# Patient Record
Sex: Male | Born: 1965 | Race: White | Hispanic: No | Marital: Married | Smoking: Current every day smoker
Health system: Southern US, Community
[De-identification: ages and names within clinical notes are randomized; demographics above are authoritative.]

## PROBLEM LIST (undated history)

## (undated) DIAGNOSIS — I4891 Unspecified atrial fibrillation: Secondary | ICD-10-CM

## (undated) DIAGNOSIS — I471 Supraventricular tachycardia, unspecified: Secondary | ICD-10-CM

## (undated) DIAGNOSIS — E039 Hypothyroidism, unspecified: Secondary | ICD-10-CM

## (undated) DIAGNOSIS — E785 Hyperlipidemia, unspecified: Secondary | ICD-10-CM

## (undated) DIAGNOSIS — I509 Heart failure, unspecified: Secondary | ICD-10-CM

## (undated) DIAGNOSIS — J449 Chronic obstructive pulmonary disease, unspecified: Secondary | ICD-10-CM

## (undated) DIAGNOSIS — J410 Simple chronic bronchitis: Secondary | ICD-10-CM

## (undated) DIAGNOSIS — K420 Umbilical hernia with obstruction, without gangrene: Secondary | ICD-10-CM

## (undated) DIAGNOSIS — I10 Essential (primary) hypertension: Secondary | ICD-10-CM

## (undated) HISTORY — DX: Umbilical hernia with obstruction, without gangrene: K42.0

---

## 2008-10-19 ENCOUNTER — Emergency Department: Payer: Self-pay | Admitting: Emergency Medicine

## 2011-01-26 ENCOUNTER — Emergency Department: Payer: Self-pay | Admitting: Emergency Medicine

## 2013-12-27 ENCOUNTER — Emergency Department: Payer: Self-pay | Admitting: Emergency Medicine

## 2015-12-08 ENCOUNTER — Ambulatory Visit: Payer: Self-pay

## 2015-12-08 DIAGNOSIS — Z299 Encounter for prophylactic measures, unspecified: Secondary | ICD-10-CM

## 2015-12-09 LAB — CMP12+LP+TP+TSH+6AC+PSA+CBC…
ALBUMIN: 4.7 g/dL (ref 3.5–5.5)
ALK PHOS: 83 IU/L (ref 39–117)
ALT: 40 IU/L (ref 0–44)
AST: 25 IU/L (ref 0–40)
Albumin/Globulin Ratio: 1.4 (ref 1.1–2.5)
BASOS ABS: 0 10*3/uL (ref 0.0–0.2)
BUN/Creatinine Ratio: 7 — ABNORMAL LOW (ref 9–20)
BUN: 8 mg/dL (ref 6–24)
Basos: 0 %
Bilirubin Total: 0.7 mg/dL (ref 0.0–1.2)
CHOLESTEROL TOTAL: 277 mg/dL — AB (ref 100–199)
Calcium: 9.7 mg/dL (ref 8.7–10.2)
Chloride: 94 mmol/L — ABNORMAL LOW (ref 96–106)
Chol/HDL Ratio: 11.5 ratio units — ABNORMAL HIGH (ref 0.0–5.0)
Creatinine, Ser: 1.15 mg/dL (ref 0.76–1.27)
EOS (ABSOLUTE): 0.2 10*3/uL (ref 0.0–0.4)
EOS: 2 %
ESTIMATED CHD RISK: 2.1 times avg. — AB (ref 0.0–1.0)
FREE THYROXINE INDEX: 3 (ref 1.2–4.9)
GFR calc Af Amer: 85 mL/min/{1.73_m2} (ref >59)
GFR calc non Af Amer: 73 mL/min/{1.73_m2} (ref >59)
GGT: 62 IU/L (ref 0–65)
GLOBULIN, TOTAL: 3.3 g/dL (ref 1.5–4.5)
Glucose: 96 mg/dL (ref 65–99)
HDL: 24 mg/dL — ABNORMAL LOW (ref >39)
HEMOGLOBIN: 16.2 g/dL (ref 12.6–17.7)
Hematocrit: 46.5 % (ref 37.5–51.0)
IMMATURE GRANULOCYTES: 0 %
IRON: 103 ug/dL (ref 38–169)
Immature Grans (Abs): 0 10*3/uL (ref 0.0–0.1)
LDH: 157 IU/L (ref 121–224)
LYMPHS ABS: 2.1 10*3/uL (ref 0.7–3.1)
Lymphs: 21 %
MCH: 31.6 pg (ref 26.6–33.0)
MCHC: 34.8 g/dL (ref 31.5–35.7)
MCV: 91 fL (ref 79–97)
Monocytes Absolute: 0.8 10*3/uL (ref 0.1–0.9)
Monocytes: 8 %
NEUTROS PCT: 69 %
Neutrophils Absolute: 6.9 10*3/uL (ref 1.4–7.0)
PLATELETS: 218 10*3/uL (ref 150–379)
PROSTATE SPECIFIC AG, SERUM: 0.5 ng/mL (ref 0.0–4.0)
Phosphorus: 3.4 mg/dL (ref 2.5–4.5)
Potassium: 5.1 mmol/L (ref 3.5–5.2)
RBC: 5.13 x10E6/uL (ref 4.14–5.80)
RDW: 14.3 % (ref 12.3–15.4)
Sodium: 140 mmol/L (ref 134–144)
T3 UPTAKE RATIO: 25 % (ref 24–39)
T4, Total: 12 ug/dL (ref 4.5–12.0)
TOTAL PROTEIN: 8 g/dL (ref 6.0–8.5)
TRIGLYCERIDES: 522 mg/dL — AB (ref 0–149)
TSH: 14.82 u[IU]/mL — ABNORMAL HIGH (ref 0.450–4.500)
Uric Acid: 9.4 mg/dL — ABNORMAL HIGH (ref 3.7–8.6)
WBC: 10 10*3/uL (ref 3.4–10.8)

## 2015-12-09 NOTE — Progress Notes (Signed)
Lab results have been sent to Dr. Ellsworth Lennox.

## 2016-04-12 ENCOUNTER — Other Ambulatory Visit: Payer: Self-pay

## 2016-04-12 DIAGNOSIS — Z299 Encounter for prophylactic measures, unspecified: Secondary | ICD-10-CM

## 2016-04-12 NOTE — Progress Notes (Signed)
Patient came in to have blood drawn per Dr. Ellsworth Lennoxejan-Sie orders.  Blood was drawn from the right arm without any incident.  Patient wants results sent to Dr. Ellsworth Lennoxejan-Sie when they are finalized.

## 2016-04-13 LAB — TSH: TSH: 4 u[IU]/mL (ref 0.450–4.500)

## 2016-04-13 LAB — HEPATIC FUNCTION PANEL
ALBUMIN: 4.5 g/dL (ref 3.5–5.5)
ALK PHOS: 80 IU/L (ref 39–117)
ALT: 31 IU/L (ref 0–44)
AST: 27 IU/L (ref 0–40)
Bilirubin Total: 0.7 mg/dL (ref 0.0–1.2)
Bilirubin, Direct: 0.16 mg/dL (ref 0.00–0.40)
TOTAL PROTEIN: 7.6 g/dL (ref 6.0–8.5)

## 2016-04-13 LAB — LIPID PANEL
CHOLESTEROL TOTAL: 225 mg/dL — AB (ref 100–199)
Chol/HDL Ratio: 9.4 ratio units — ABNORMAL HIGH (ref 0.0–5.0)
HDL: 24 mg/dL — AB (ref >39)
TRIGLYCERIDES: 524 mg/dL — AB (ref 0–149)

## 2016-06-02 ENCOUNTER — Emergency Department: Payer: Managed Care, Other (non HMO)

## 2016-06-02 ENCOUNTER — Emergency Department
Admission: EM | Admit: 2016-06-02 | Discharge: 2016-06-02 | Disposition: A | Discharge status: Home / Self Care | Payer: Managed Care, Other (non HMO) | Attending: Emergency Medicine | Admitting: Emergency Medicine

## 2016-06-02 DIAGNOSIS — I471 Supraventricular tachycardia: Secondary | ICD-10-CM | POA: Diagnosis not present

## 2016-06-02 DIAGNOSIS — F17210 Nicotine dependence, cigarettes, uncomplicated: Secondary | ICD-10-CM | POA: Diagnosis not present

## 2016-06-02 DIAGNOSIS — N289 Disorder of kidney and ureter, unspecified: Secondary | ICD-10-CM | POA: Diagnosis not present

## 2016-06-02 DIAGNOSIS — Z79899 Other long term (current) drug therapy: Secondary | ICD-10-CM | POA: Diagnosis not present

## 2016-06-02 DIAGNOSIS — I4891 Unspecified atrial fibrillation: Secondary | ICD-10-CM | POA: Diagnosis not present

## 2016-06-02 DIAGNOSIS — R000 Tachycardia, unspecified: Secondary | ICD-10-CM | POA: Diagnosis present

## 2016-06-02 DIAGNOSIS — I10 Essential (primary) hypertension: Secondary | ICD-10-CM | POA: Diagnosis not present

## 2016-06-02 HISTORY — DX: Hyperlipidemia, unspecified: E78.5

## 2016-06-02 HISTORY — DX: Unspecified atrial fibrillation: I48.91

## 2016-06-02 HISTORY — DX: Essential (primary) hypertension: I10

## 2016-06-02 LAB — CBC WITH DIFFERENTIAL/PLATELET
BASOS PCT: 1 %
Basophils Absolute: 0.2 10*3/uL — ABNORMAL HIGH (ref 0–0.1)
Eosinophils Absolute: 0.6 10*3/uL (ref 0–0.7)
Eosinophils Relative: 3 %
HEMATOCRIT: 47.9 % (ref 40.0–52.0)
HEMOGLOBIN: 16.2 g/dL (ref 13.0–18.0)
Lymphocytes Relative: 20 %
Lymphs Abs: 3.9 10*3/uL — ABNORMAL HIGH (ref 1.0–3.6)
MCH: 30.8 pg (ref 26.0–34.0)
MCHC: 33.9 g/dL (ref 32.0–36.0)
MCV: 91 fL (ref 80.0–100.0)
MONOS PCT: 6 %
Monocytes Absolute: 1.2 10*3/uL — ABNORMAL HIGH (ref 0.2–1.0)
NEUTROS ABS: 13.8 10*3/uL — AB (ref 1.4–6.5)
NEUTROS PCT: 70 %
PLATELETS: 281 10*3/uL (ref 150–440)
RBC: 5.27 MIL/uL (ref 4.40–5.90)
RDW: 13.4 % (ref 11.5–14.5)
WBC: 19.7 10*3/uL — AB (ref 3.8–10.6)

## 2016-06-02 LAB — URINALYSIS COMPLETE WITH MICROSCOPIC (ARMC ONLY)
Bacteria, UA: NONE SEEN
Bilirubin Urine: NEGATIVE
Glucose, UA: NEGATIVE mg/dL
HGB URINE DIPSTICK: NEGATIVE
KETONES UR: NEGATIVE mg/dL
LEUKOCYTES UA: NEGATIVE
NITRITE: NEGATIVE
PROTEIN: NEGATIVE mg/dL
RBC / HPF: NONE SEEN RBC/hpf (ref 0–5)
SPECIFIC GRAVITY, URINE: 1.006 (ref 1.005–1.030)
Squamous Epithelial / LPF: NONE SEEN
pH: 6 (ref 5.0–8.0)

## 2016-06-02 LAB — COMPREHENSIVE METABOLIC PANEL
ALBUMIN: 4.2 g/dL (ref 3.5–5.0)
ALK PHOS: 77 U/L (ref 38–126)
ALT: 50 U/L (ref 17–63)
ANION GAP: 10 (ref 5–15)
AST: 37 U/L (ref 15–41)
BUN: 13 mg/dL (ref 6–20)
CALCIUM: 9.4 mg/dL (ref 8.9–10.3)
CHLORIDE: 99 mmol/L — AB (ref 101–111)
CO2: 28 mmol/L (ref 22–32)
Creatinine, Ser: 1.61 mg/dL — ABNORMAL HIGH (ref 0.61–1.24)
GFR calc non Af Amer: 48 mL/min — ABNORMAL LOW (ref >60)
GFR, EST AFRICAN AMERICAN: 55 mL/min — AB (ref >60)
GLUCOSE: 151 mg/dL — AB (ref 65–99)
POTASSIUM: 5 mmol/L (ref 3.5–5.1)
SODIUM: 137 mmol/L (ref 135–145)
Total Bilirubin: 0.9 mg/dL (ref 0.3–1.2)
Total Protein: 7.8 g/dL (ref 6.5–8.1)

## 2016-06-02 LAB — TROPONIN I: Troponin I: <0.03 ng/mL (ref <0.03)

## 2016-06-02 MED ORDER — DILTIAZEM HCL 25 MG/5ML IV SOLN
20.0000 mg | Freq: Once | INTRAVENOUS | Status: AC
  Administered 2016-06-02: 20 mg via INTRAVENOUS

## 2016-06-02 MED ORDER — SODIUM CHLORIDE 0.9 % IV BOLUS (SEPSIS): 1000.0000 mL | Freq: Once | INTRAVENOUS | Status: DC

## 2016-06-02 MED ORDER — AMIODARONE HCL 400 MG PO TABS: 400.0000 mg | ORAL_TABLET | Freq: Every day | ORAL | 0 refills | Status: DC

## 2016-06-02 MED ORDER — DILTIAZEM HCL 25 MG/5ML IV SOLN
INTRAVENOUS | Status: AC
  Filled 2016-06-02: qty 5

## 2016-06-02 MED ORDER — ADENOSINE 6 MG/2ML IV SOLN
6.0000 mg | Freq: Once | INTRAVENOUS | Status: AC
  Administered 2016-06-02: 6 mg via INTRAVENOUS

## 2016-06-02 MED ORDER — AMIODARONE HCL 200 MG PO TABS
800.0000 mg | ORAL_TABLET | Freq: Once | ORAL | Status: DC
  Filled 2016-06-02: qty 4

## 2016-06-02 MED ORDER — SODIUM CHLORIDE 0.9 % IV BOLUS (SEPSIS)
1000.0000 mL | Freq: Once | INTRAVENOUS | Status: AC
  Administered 2016-06-02: 1000 mL via INTRAVENOUS

## 2016-06-02 MED ORDER — AMIODARONE HCL 200 MG PO TABS
400.0000 mg | ORAL_TABLET | Freq: Every day | ORAL | Status: DC
  Administered 2016-06-02: 400 mg via ORAL
  Filled 2016-06-02: qty 2

## 2016-06-02 MED ORDER — DILTIAZEM HCL 25 MG/5ML IV SOLN
INTRAVENOUS | Status: AC
  Administered 2016-06-02: 20 mg via INTRAVENOUS
  Filled 2016-06-02: qty 5

## 2016-06-02 NOTE — ED Triage Notes (Signed)
Pt came to ED from doctors office.Sent over for SVT. History of SVT, had to be shocked before. Not c/o chest pain. History of afib.

## 2016-06-02 NOTE — Discharge Instructions (Signed)
Take amiodarone 400 mg daily.   Continue your other meds.   See Dr. Welton Flakes on Monday at 10 am for follow up.   Your kidney function is slightly abnormal, please recheck with your doctor.   Return to ER if you have worse palpitations, chest pain, shortness of breath.

## 2016-06-02 NOTE — ED Provider Notes (Signed)
[facility]-EMERGENCY DEPARTMENT Provider Note   CSN: [id number] Arrival date & time: 06/02/16  1011  First Provider Contact:  First MD Initiated Contact with Patient 06/02/16 1020        History   Chief Complaint Chief Complaint  Patient presents with  . Tachycardia    HPI Max Dixon is a 50 y.o. male hx of afib on metoprolol, HL, HTN, here with palpitations. Acute onset of palpitation occur this morning. States that he woke up around 7:30 am and felt fine and then had acute onset of Palpitations. He went to cardiology, Dr. Milta Deiters office, and had echo that showed nl EF. He was diagnosed with SVT and was given metoprolol 100 mg PO but didn't help so sent to the ED. Had previous normal stress test per Max Dixon. Denies any chest pain or shortness of breath or recent travel.    The history is provided by the patient.    Past Medical History:  Diagnosis Date  . A-fib (HCC)   . Hyperlipidemia   . Hypertension     There are no active problems to display for this patient.   History reviewed. No pertinent surgical history.     Home Medications    Prior to Admission medications   Medication Sig Start Date End Date Taking? Authorizing Provider  levothyroxine (SYNTHROID, LEVOTHROID) 175 MCG tablet Take 175 mcg by mouth every morning. 05/15/16  Yes Historical Provider, MD  lisinopril-hydrochlorothiazide (PRINZIDE,ZESTORETIC) 20-12.5 MG tablet Take 2 tablets by mouth daily. 05/15/16  Yes Historical Provider, MD  magnesium oxide (MAG-OX) 400 MG tablet Take 400 mg by mouth daily.   Yes Historical Provider, MD  metoprolol (LOPRESSOR) 50 MG tablet Take 50 mg by mouth 2 (two) times daily. 05/19/16  Yes Historical Provider, MD  simvastatin (ZOCOR) 20 MG tablet Take 1 tablet by mouth daily. 05/26/16  Yes Historical Provider, MD    Family History No family history on file.  Social History Social History  Substance Use Topics  . Smoking status: Current Every Day Smoker    Packs/day:  2.00    Types: Cigarettes  . Smokeless tobacco: Never Used  . Alcohol use No     Allergies   Review of patient's allergies indicates no known allergies.   Review of Systems Review of Systems  Cardiovascular: Positive for palpitations.  All other systems reviewed and are negative.    Physical Exam Updated Vital Signs BP 102/82   Pulse 65   Resp (!) 25   SpO2 98%   Physical Exam  Constitutional: He is oriented to person, place, and time.  Uncomfortable   HENT:  Head: Normocephalic.  Eyes: Conjunctivae and EOM are normal. Pupils are equal, round, and reactive to light.  Neck: Normal range of motion. Neck supple.  Cardiovascular:  Tachycardic, irregular   Pulmonary/Chest: Effort normal and breath sounds normal.  Abdominal: Soft. Bowel sounds are normal.  Musculoskeletal: Normal range of motion. He exhibits no edema or deformity.  Neurological: He is alert and oriented to person, place, and time.  Skin: Skin is warm.  Psychiatric: He has a normal mood and affect.  Nursing note and vitals reviewed.    ED Treatments / Results  Labs (all labs ordered are listed, but only abnormal results are displayed) Labs Reviewed  CBC WITH DIFFERENTIAL/PLATELET - Abnormal; Notable for the following:       Result Value   WBC 19.7 (*)    Neutro Abs 13.8 (*)    Lymphs Abs 3.9 (*)  Monocytes Absolute 1.2 (*)    Basophils Absolute 0.2 (*)    All other components within normal limits  COMPREHENSIVE METABOLIC PANEL - Abnormal; Notable for the following:    Chloride 99 (*)    Glucose, Bld 151 (*)    Creatinine, Ser 1.61 (*)    GFR calc non Af Amer 48 (*)    GFR calc Af Amer 55 (*)    All other components within normal limits  URINALYSIS COMPLETEWITH MICROSCOPIC ([facility] ONLY) - Abnormal; Notable for the following:    Color, Urine YELLOW (*)    APPearance CLEAR (*)    All other components within normal limits  TROPONIN I   CRITICAL CARE Performed by: Max Dixon   Total  critical care time: 30 minutes  Critical care time was exclusive of separately billable procedures and treating other patients.  Critical care was necessary to treat or prevent imminent or life-threatening deterioration.  Critical care was time spent personally by me on the following activities: development of treatment plan with patient and/or surrogate as well as nursing, discussions with consultants, evaluation of patient's response to treatment, examination of patient, obtaining history from patient or surrogate, ordering and performing treatments and interventions, ordering and review of laboratory studies, ordering and review of radiographic studies, pulse oximetry and re-evaluation of patient's condition.   EKG  EKG Interpretation None      ED ECG REPORT I, Max Dixon, the attending physician, personally viewed and interpreted this ECG.   Date: 06/02/2016  EKG Time: 10: 10 am  Rate: 185  Rhythm: SVT vs afib  Axis: normal  Intervals:right bundle branch block  ST&T Change: rate related changes   ED ECG REPORT I, Max Dixon, the attending physician, personally viewed and interpreted this ECG.   Date: 06/02/2016  EKG Time: 10:25 am  Rate: 82  Rhythm: normal EKG, normal sinus rhythm  Axis: normal  Intervals:right bundle branch block  ST&T Change: Rate related changes resolved compared to previous EKG     Radiology Dg Chest Port 1 View  Result Date: 06/02/2016 CLINICAL DATA:  Atrial fibrillation with supraventricular tachycardia. EXAM: PORTABLE CHEST 1 VIEW COMPARISON:  One-view chest x-ray 01/26/2011. FINDINGS: Pacing/defibrillator pad is in place. The heart size is normal. There is no edema or effusion. The visualized soft tissues and bony thorax are unremarkable. IMPRESSION: No acute cardiopulmonary disease. Electronically Signed   By: Marin Roberts M.D.   On: 06/02/2016 10:47    Procedures Procedures (including critical care time)  Medications Ordered in  ED Medications  amiodarone (PACERONE) tablet 400 mg (400 mg Oral Given 06/02/16 1049)  amiodarone (PACERONE) tablet 400 mg (400 mg Oral Given 06/02/16 1201)  diltiazem (CARDIZEM) injection 20 mg (20 mg Intravenous Not Given 06/02/16 1202)  sodium chloride 0.9 % bolus 1,000 mL (0 mLs Intravenous Stopped 06/02/16 1241)  adenosine (ADENOCARD) 6 MG/2ML injection 6 mg (6 mg Intravenous Given 06/02/16 1026)  sodium chloride 0.9 % bolus 1,000 mL (0 mLs Intravenous Stopped 06/02/16 1310)     Initial Impression / Assessment and Plan / ED Course  I have reviewed the triage vital signs and the nursing notes.  Pertinent labs & imaging results that were available during my care of the patient were reviewed by me and considered in my medical decision making (see chart for details).  Clinical Course   XZAYVIAN MACNAUGHTON is a 50 y.o. male here with palpitations. Patient initially was in SVT vs rapid afib. I tried cardizem with  no improvement. Consulted Max Dixon from cardiology, who recommend adenosine and loading with amiodarone and possibly cardioversion if refractory. Patient is adamant that he doesn't want to stay in the hospital. Will check labs, trop. Will reassess after meds.   10 :25 am After 6 mg adenosine, went back to sinus rhythm. Will load with amiodarone PO. Max Dixon recommend 800 mg PO but his BP slightly soft (90s) so will give 400 first and then repeat BP and if tolerate give another 400 mg.   1:15 PM WBC 20. Cr 1.6, baseline 1.15. UA nl. Has no fever. BP improved to low 100s after given amiodarone. Never went back to SVT or afib. Called Max Dixon again, who recommend amiodarone 400 mg daily. He will see patient Monday at 10 am.     Final Clinical Impressions(s) / ED Diagnoses   Final diagnoses:  None    New Prescriptions New Prescriptions   No medications on file     Charlynne Pander, MD 06/02/16 1316

## 2016-06-02 NOTE — ED Notes (Signed)
X-ray at bedside

## 2016-06-02 NOTE — ED Notes (Addendum)
Pt was given 100mg  metoprolol PO at [facility] prior to arrival to ED for heart rate of 170 which was not effective. edp aware.

## 2016-07-18 ENCOUNTER — Encounter: Payer: Self-pay | Admitting: Cardiology

## 2016-07-18 NOTE — Progress Notes (Signed)
Electrophysiology Office Note   Date:  07/19/2016   ID:  Max Dixon, DOB [birth year], MRN [id number]  PCP:  [facility]  Primary Electrophysiologist:  Rhydian Baldi Jorja LoaMartin Zamara Cozad, MD    Chief Complaint  Patient presents with  . Advice Only     History of Present Illness: Max Dixon is a 50 y.o. male who presents today for electrophysiology evaluation.   Hx AFib, HLD, HTNpresetned to the ER on 8/4 with palpitations. He was found to be in SVT with a heart rate of 180. He had an echocardiogram that showed an ejection fraction of 67% and grade 3 diastolic dysfunction. At the time of his echo, his heart rate was 170+. He was unable to be converted and was sent to the emergency room where he received 6 mg of adenosine and converted to sinus rhythm. He was started on 400 mg of amiodarone daily and has since been switched to 200 mg daily. Since starting the amiodarone he has had no further episodes of SVT. There are no exacerbating or alleviating factors for his episodes. He says that he feels extreme fatigue, sweating, and palpitations. He has not been taking the amiodarone for the last 3 days as his prescription ran out.   Today, he denies symptoms of palpitations, chest pain, shortness of breath, orthopnea, PND, lower extremity edema, claudication, dizziness, presyncope, syncope, bleeding, or neurologic sequela. The patient is tolerating medications without difficulties and is otherwise without complaint today.    Past Medical History:  Diagnosis Date  . A-fib (HCC)   . Hyperlipidemia   . Hypertension    History reviewed. No pertinent surgical history.   Current Outpatient Prescriptions  Medication Sig Dispense Refill  . amiodarone (PACERONE) 200 MG tablet Take 200 mg by mouth daily.  0  . levothyroxine (SYNTHROID, LEVOTHROID) 175 MCG tablet Take 175 mcg by mouth every morning.  0  . lisinopril-hydrochlorothiazide (PRINZIDE,ZESTORETIC) 20-12.5 MG tablet Take 2 tablets  by mouth daily.  0  . magnesium oxide (MAG-OX) 400 MG tablet Take 400 mg by mouth daily.    . metoprolol (LOPRESSOR) 50 MG tablet Take 50 mg by mouth 2 (two) times daily.  0  . simvastatin (ZOCOR) 20 MG tablet Take 1 tablet by mouth daily.  0   No current facility-administered medications for this visit.     Allergies:   Review of patient's allergies indicates no known allergies.   Social History:  The patient  reports that he has been smoking Cigarettes.  He has been smoking about 2.00 packs per day. He has never used smokeless tobacco. He reports that he does not drink alcohol or use drugs.   Family History:  The patient's family history is not on file.    ROS:  Please see the history of present illness.   Otherwise, review of systems is positive for None.   All other systems are reviewed and negative.    PHYSICAL EXAM: VS:  BP 120/74   Pulse 63   Ht 5\' 9"  (1.753 m)   Wt 193 lb 3.2 oz (87.6 kg)   SpO2 95%   BMI 28.53 kg/m  , BMI Body mass index is 28.53 kg/m. GEN: Well nourished, well developed, in no acute distress  HEENT: normal  Neck: no JVD, carotid bruits, or masses Cardiac: RRR; no murmurs, rubs, or gallops,no edema  Respiratory:  clear to auscultation bilaterally, normal work of breathing GI: soft, nontender, nondistended, + BS MS: no deformity or atrophy  Skin: warm  and dry Neuro:  Strength and sensation are intact Psych: euthymic mood, full affect  EKG:  EKG is not ordered today. Personal review of the ekg ordered shows sinus rhythm, rsR'   EKG from earlier that day shows SVT with heart rates of 180.Marland Kitchen      Recent Labs: 04/12/2016: TSH 4.000 06/02/2016: ALT 50; BUN 13; Creatinine, Ser 1.61; Hemoglobin 16.2; Platelets 281; Potassium 5.0; Sodium 137    Lipid Panel     Component Value Date/Time   CHOL 225 (H) 04/12/2016 0000   TRIG 524 (H) 04/12/2016 0000   HDL 24 (L) 04/12/2016 0000   CHOLHDL 9.4 (H) 04/12/2016 0000   LDLCALC Comment 04/12/2016 0000      Wt Readings from Last 3 Encounters:  07/19/16 193 lb 3.2 oz (87.6 kg)      Other studies Reviewed: Additional studies/ records that were reviewed today include: TTE 06/02/16  Review of the above records today demonstrates:  Ejection fraction 67%, grade 3 diastolic dysfunction, heart rate 170+, left atrium 42 mm mild mitral regurgitation and mild tricuspid regurgitation   ASSESSMENT AND PLAN:  1.  SVT: Has been on amiodarone for approximately one month and has had no in terms of SVT. Unfortunately amiodarone has a long half life and Nai Dasch need to be washout before any EP study is done. I did discuss the possibility of EP study and ablation. Risks and benefits of the procedure were discussed. Risks include bleeding, tamponade, heart block, stroke, among others. He understands the risks and has agreed to the procedure. Due to the long half-life of amiodarone, we'll schedule the procedure for 2 months down the road. We'll increase his metoprolol to 100 mg twice a day in the interim.  2. Hypertension: Well-controlled  3. Hyperlipidemia: Continue simvastatin  Current medicines are reviewed at length with the patient today.   The patient does not have concerns regarding his medicines.  The following changes were made today:  Stop amiodarone, increase metoprolol to 100 mg  Labs/ tests ordered today include:  No orders of the defined types were placed in this encounter.    Disposition:   FU with Darika Ildefonso 1 months  Signed, Darryl Willner [name], MD  07/19/2016 8:55 AM     [facility] [address] [place] [address] [phone] (office) [phone] (fax)

## 2016-07-19 ENCOUNTER — Encounter: Payer: Self-pay | Admitting: Cardiology

## 2016-07-19 ENCOUNTER — Encounter: Payer: Self-pay | Admitting: *Deleted

## 2016-07-19 ENCOUNTER — Ambulatory Visit (INDEPENDENT_AMBULATORY_CARE_PROVIDER_SITE_OTHER): Payer: Managed Care, Other (non HMO) | Admitting: Cardiology

## 2016-07-19 VITALS — BP 120/74 | HR 63 | Ht 69.0 in | Wt 193.2 lb

## 2016-07-19 DIAGNOSIS — I471 Supraventricular tachycardia: Secondary | ICD-10-CM | POA: Diagnosis not present

## 2016-07-19 MED ORDER — METOPROLOL SUCCINATE ER 100 MG PO TB24: 100.0000 mg | ORAL_TABLET | Freq: Every day | ORAL | 3 refills | Status: DC

## 2016-07-19 MED ORDER — METOPROLOL TARTRATE 100 MG PO TABS: 100.0000 mg | ORAL_TABLET | Freq: Two times a day (BID) | ORAL | 3 refills | Status: DC

## 2016-07-19 NOTE — Patient Instructions (Signed)
Medication Instructions:    Your physician has recommended you make the following change in your medication:  1) STOP Amiodarone 2) INCREASE Lopressor (Metoprolol) to 100 mg twice a day -  A new prescriptions for 100 mg tablets sent to pharmacy.  --- If you need a refill on your cardiac medications before your next appointment, please call your pharmacy. ---  Labwork:  None ordered  Testing/Procedures:  None ordered  Follow-Up:  Your physician recommends that you follow up with Dr. Elberta Dixon the week of 10/30-11/3 for and H&P and pre procedure labs.   Your physician recommends that you schedule a follow-up appointment in: 4 weeks, after your procedure on 09/04/16, with Dr. Elberta Dixon.  Thank you for choosing [facility]!!   Dory Horn, RN [phone]  Any Other Special Instructions Will Be Listed Below (If Applicable).  Cardiac Ablation Cardiac ablation is a procedure to disable a small amount of heart tissue in very specific places. The heart has many electrical connections. Sometimes these connections are abnormal and can cause the heart to beat very fast or irregularly. By disabling some of the problem areas, heart rhythm can be improved or made normal. Ablation is done for people who:   Have Wolff-Parkinson-White syndrome.   Have other fast heart rhythms (tachycardia).   Have taken medicines for an abnormal heart rhythm (arrhythmia) that resulted in:   No success.   Side effects.   May have a high-risk heartbeat that could result in death.  LET [facility] CARE PROVIDER KNOW ABOUT:   Any allergies you have or any previous reactions you have had to X-ray dye, food (such as seafood), medicine, or tape.   All medicines you are taking, including vitamins, herbs, eye drops, creams, and over-the-counter medicines.   Previous problems you or members of your family have had with the use of anesthetics.   Any blood disorders you have.   Previous surgeries  or procedures (such as a kidney transplant) you have had.   Medical conditions you have (such as kidney failure).  RISKS AND COMPLICATIONS Generally, cardiac ablation is a safe procedure. However, problems can occur and include:   Increased risk of cancer. Depending on how long it takes to do the ablation, the dose of radiation can be high.  Bruising and bleeding where a thin, flexible tube (catheter) was inserted during the procedure.   Bleeding into the chest, especially into the sac that surrounds the heart (serious).  Need for a permanent pacemaker if the normal electrical system is damaged.   The procedure may not be fully effective, and this may not be recognized for months. Repeat ablation procedures are sometimes required. BEFORE THE PROCEDURE   Follow any instructions from your health care provider regarding eating and drinking before the procedure.   Take your medicines as directed at regular times with water, unless instructed otherwise by your health care provider. If you are taking diabetes medicine, including insulin, ask how you are to take it and if there are any special instructions you should follow. It is common to adjust insulin dosing the day of the ablation.  PROCEDURE  An ablation is usually performed in a catheterization laboratory with the guidance of fluoroscopy. Fluoroscopy is a type of X-ray that helps your health care provider see images of your heart during the procedure.   An ablation is a minimally invasive procedure. This means a small cut (incision) is made in either your neck or groin. Your health care provider will decide where  to make the incision based on your medical history and physical exam.  An IV tube will be started before the procedure begins. You will be given an anesthetic or medicine to help you relax (sedative).  The skin on your neck or groin will be numbed. A needle will be inserted into a large vein in your neck or groin and  catheters will be threaded to your heart.  A special dye that shows up on fluoroscopy pictures may be injected through the catheter. The dye helps your health care provider see the area of the heart that needs treatment.  The catheter has electrodes on the tip. When the area of heart tissue that is causing the arrhythmia is found, the catheter tip will send an electrical current to the area and "scar" the tissue. Three types of energy can be used to ablate the heart tissue:   Heat (radiofrequency energy).   Laser energy.   Extreme cold (cryoablation).   When the area of the heart has been ablated, the catheter will be taken out. Pressure will be held on the insertion site. This will help the insertion site clot and keep it from bleeding. A bandage will be placed on the insertion site.  AFTER THE PROCEDURE   After the procedure, you will be taken to a recovery area where your vital signs (blood pressure, heart rate, and breathing) will be monitored. The insertion site will also be monitored for bleeding.   You will need to lie still for 4-6 hours. This is to ensure you do not bleed from the catheter insertion site.    This information is not intended to replace advice given to you by your health care provider. Make sure you discuss any questions you have with your health care provider.   Document Released: 03/04/2009 Document Revised: 11/06/2014 Document Reviewed: 03/10/2013 Elsevier Interactive Patient Education [employer]2016 Elsevier Inc.

## 2016-08-10 ENCOUNTER — Encounter: Payer: Self-pay | Admitting: *Deleted

## 2016-08-21 ENCOUNTER — Telehealth: Payer: Self-pay | Admitting: Cardiology

## 2016-08-21 NOTE — Telephone Encounter (Signed)
New message      Pt c/o of Chest Pain: STAT if CP now or developed within 24 hours  1. Are you having CP right now? Wife states he was having them this am---she is at work now  2. Are you experiencing any other symptoms (ex. SOB, nausea, vomiting, sweating)?  dizziness 3. How long have you been experiencing CP?  Off and on for 4 days-------pt stopped amiodarone at last ov.  He has a hosp procedure scheduled  4. Is your CP continuous or coming and going?  Comes and go 5. Have you taken Nitroglycerin?  no? Please call pt at [phone]

## 2016-08-21 NOTE — Telephone Encounter (Signed)
Spoke with pt and he states that he has been having CP since this morning.  Pt states it has improved slightly but has not gone away.  No Nitro available.  Pt states pain is in chest and both arms.  Vitals this morning 121/80, HR 65 or 75 (pt couldn't remember which one).  Pt denies sweating, N/V, HA or visual disturbances.  Pt stated he plans on seeing if it improves throughout the day and either calling us or his PCP later if it doesn't.  Advised pt that it was best to get checked out now and see what is going on.  Pt in agreement to proceed to ER for evaluation.  [facility] as he lives in [place].  Advised I will send message to Dr. Elberta Fortisamnitz and his nurse to make them aware.

## 2016-08-22 NOTE — Telephone Encounter (Signed)
Followed up with patient who tells me that he did not go to ED as recommended.  States yesterday that it "felt like a man standing there with a hammer hitting my rib cage". He reports feeling much better than he did yesterday. He is not experiencing any "pain" at the current time. He complains of feeling extremely fatigued with no energy since last OV med changes.  Explained that it is not uncommon to have these symptoms after starting/increasing BB medication. Patient would like to decrease med back to original dosing of Lopressor 50 mg BID (currently taking 100 mg BID that was increased at last OV).  Discussed possibility that HR may increase with decrease of Lopressor. Patient understands I will send to Dr. Elberta Fortisamnitz for advisement and call him once reviewed. Patient verbalized understanding and agreeable to plan.

## 2016-08-24 NOTE — Telephone Encounter (Signed)
lmtcb to inform pt ok to reduce Lopressor ot 50 mg BID

## 2016-08-28 ENCOUNTER — Other Ambulatory Visit: Payer: Self-pay

## 2016-08-28 DIAGNOSIS — Z299 Encounter for prophylactic measures, unspecified: Secondary | ICD-10-CM

## 2016-08-28 NOTE — Progress Notes (Signed)
Patient came in to have blood drawn for testing per Dr[facility] Elber[facility] from [facility] and Dr. Ellsworth Lennoxejan-Sie from [facility] orders.

## 2016-08-29 ENCOUNTER — Ambulatory Visit: Payer: Self-pay | Admitting: Cardiology

## 2016-08-29 LAB — CMP12+LP+TP+TSH+6AC+CBC/D/PLT
A/G RATIO: 1.4 (ref 1.2–2.2)
ALK PHOS: 79 IU/L (ref 39–117)
ALT: 28 IU/L (ref 0–44)
AST: 19 IU/L (ref 0–40)
Albumin: 4.4 g/dL (ref 3.5–5.5)
BASOS ABS: 0.1 10*3/uL (ref 0.0–0.2)
BASOS: 1 %
BILIRUBIN TOTAL: 0.6 mg/dL (ref 0.0–1.2)
BUN / CREAT RATIO: 6 — AB (ref 9–20)
BUN: 7 mg/dL (ref 6–24)
CHLORIDE: 95 mmol/L — AB (ref 96–106)
CHOL/HDL RATIO: 8.2 ratio — AB (ref 0.0–5.0)
CREATININE: 1.16 mg/dL (ref 0.76–1.27)
Calcium: 9.6 mg/dL (ref 8.7–10.2)
Cholesterol, Total: 205 mg/dL — ABNORMAL HIGH (ref 100–199)
EOS (ABSOLUTE): 0.3 10*3/uL (ref 0.0–0.4)
EOS: 3 %
ESTIMATED CHD RISK: 1.7 times avg. — AB (ref 0.0–1.0)
Free Thyroxine Index: 2.8 (ref 1.2–4.9)
GFR, EST AFRICAN AMERICAN: 83 mL/min/{1.73_m2} (ref >59)
GFR, EST NON AFRICAN AMERICAN: 72 mL/min/{1.73_m2} (ref >59)
GGT: 53 IU/L (ref 0–65)
GLUCOSE: 92 mg/dL (ref 65–99)
Globulin, Total: 3.1 g/dL (ref 1.5–4.5)
HDL: 25 mg/dL — AB (ref >39)
HEMATOCRIT: 44.8 % (ref 37.5–51.0)
HEMOGLOBIN: 15.1 g/dL (ref 12.6–17.7)
IMMATURE GRANULOCYTES: 1 %
Immature Grans (Abs): 0 10*3/uL (ref 0.0–0.1)
Iron: 74 ug/dL (ref 38–169)
LDH: 170 IU/L (ref 121–224)
LYMPHS ABS: 1.8 10*3/uL (ref 0.7–3.1)
Lymphs: 24 %
MCH: 31.1 pg (ref 26.6–33.0)
MCHC: 33.7 g/dL (ref 31.5–35.7)
MCV: 92 fL (ref 79–97)
MONOCYTES: 8 %
Monocytes Absolute: 0.6 10*3/uL (ref 0.1–0.9)
Neutrophils Absolute: 4.9 10*3/uL (ref 1.4–7.0)
Neutrophils: 63 %
PLATELETS: 219 10*3/uL (ref 150–379)
Phosphorus: 3.8 mg/dL (ref 2.5–4.5)
Potassium: 4.2 mmol/L (ref 3.5–5.2)
RBC: 4.85 x10E6/uL (ref 4.14–5.80)
RDW: 14 % (ref 12.3–15.4)
SODIUM: 140 mmol/L (ref 134–144)
T3 Uptake Ratio: 29 % (ref 24–39)
T4, Total: 9.5 ug/dL (ref 4.5–12.0)
TOTAL PROTEIN: 7.5 g/dL (ref 6.0–8.5)
TSH: 7.3 u[IU]/mL — ABNORMAL HIGH (ref 0.450–4.500)
Triglycerides: 664 mg/dL (ref 0–149)
URIC ACID: 8.5 mg/dL (ref 3.7–8.6)
WBC: 7.7 10*3/uL (ref 3.4–10.8)

## 2016-08-29 NOTE — Progress Notes (Signed)
Electrophysiology Office Note   Date:  08/30/2016   ID:  Max Dixon, DOB September 28, 1966, MRN [id number]  PCP:  [facility]  Primary Electrophysiologist:  Barre Aydelott Jorja LoaMartin Cherylynn Liszewski, MD    Chief Complaint  Patient presents with  . Follow-up    SVT/H&P     History of Present Illness: Max Dixon is a 50 y.o. male who presents today for electrophysiology evaluation.   He presented to the emergency room with palpitations, and currently has an EP study scheduled for November 13. He comes in today complaining of chest pain as well. He says that about a week and a half ago he had chest pain while riding on his lawnmower. The pain was in the center of his chest and did not radiate. The pain lasted for proximally 4 days. The day prior to that, he had an episode of his penis, fatigue, and diaphoresis. He said that this episode lasted a short period of time. It is unclear whether or not this was associated with exertion.  Today, he denies symptoms of shortness of breath, orthopnea, PND, lower extremity edema, claudication, dizziness, presyncope, syncope, bleeding, or neurologic sequela. The patient is tolerating medications without difficulties and is otherwise without complaint today.    Past Medical History:  Diagnosis Date  . A-fib (HCC)   . Hyperlipidemia   . Hypertension    No past surgical history on file.   Current Outpatient Prescriptions  Medication Sig Dispense Refill  . fenofibrate (TRICOR) 145 MG tablet Take 145 mg by mouth daily.  0  . levothyroxine (SYNTHROID, LEVOTHROID) 175 MCG tablet Take 175 mcg by mouth every morning.  0  . lisinopril-hydrochlorothiazide (PRINZIDE,ZESTORETIC) 20-12.5 MG tablet Take 2 tablets by mouth daily.  0  . magnesium oxide (MAG-OX) 400 MG tablet Take 400 mg by mouth daily.    . metoprolol (LOPRESSOR) 100 MG tablet Take 1 tablet (100 mg total) by mouth 2 (two) times daily. 180 tablet 3  . rosuvastatin (CRESTOR) 20 MG tablet Take 20  mg by mouth daily.  0   No current facility-administered medications for this visit.     Allergies:   Review of patient's allergies indicates no known allergies.   Social History:  The patient  reports that he has been smoking Cigarettes.  He has been smoking about 2.00 packs per day. He has never used smokeless tobacco. He reports that he does not drink alcohol or use drugs.   Family History:  The patient's family history is not on file.    ROS:  Please see the history of present illness.   Otherwise, review of systems is positive for fatigue, chest pain, palpitations, dizziness, headaches follow-up in our does not labs that he needs his is.   All other systems are reviewed and negative.    PHYSICAL EXAM: VS:  BP (!) 138/96   Pulse (!) 56   Ht 5\' 11"  (1.803 m)   Wt 195 lb (88.5 kg)   BMI 27.20 kg/m  , BMI Body mass index is 27.2 kg/m. GEN: Well nourished, well developed, in no acute distress  HEENT: normal  Neck: no JVD, carotid bruits, or masses Cardiac: RRR; no murmurs, rubs, or gallops,no edema  Respiratory:  clear to auscultation bilaterally, normal work of breathing GI: soft, nontender, nondistended, + BS MS: no deformity or atrophy  Skin: warm and dry Neuro:  Strength and sensation are intact Psych: euthymic mood, full affect  EKG:  EKG is not ordered today. Personal review  of the ekg ordered 06/03/16 shows sinus rhythm, rsR'   EKG from earlier that day shows SVT with heart rates of 180.Marland Kitchen.      Recent Labs: 06/02/2016: Hemoglobin 16.2 08/28/2016: ALT 28; BUN 7; Creatinine, Ser 1.16; Platelets 219; Potassium 4.2; Sodium 140; TSH 7.300    Lipid Panel     Component Value Date/Time   CHOL 205 (H) 08/28/2016 0800   TRIG 664 (HH) 08/28/2016 0800   HDL 25 (L) 08/28/2016 0800   CHOLHDL 8.2 (H) 08/28/2016 0800   LDLCALC Comment 08/28/2016 0800     Wt Readings from Last 3 Encounters:  08/30/16 195 lb (88.5 kg)  07/19/16 193 lb 3.2 oz (87.6 kg)      Other studies  Reviewed: Additional studies/ records that were reviewed today include: TTE 06/02/16  Review of the above records today demonstrates:  Ejection fraction 67%, grade 3 diastolic dysfunction, heart rate 170+, left atrium 42 mm mild mitral regurgitation and mild tricuspid regurgitation   ASSESSMENT AND PLAN:  1.  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 Risks and benefits of the procedure were explained. Risks include bleeding, tamponade, heart block, and stroke, among others. He understands these risks and has agreed to the procedure.  2. Hypertension: Well-controlled  3. Hyperlipidemia:  On check of his lipids, triglycerides were greatly elevated.  Tyliah Schlereth need to switch from zocor to crestor 40. May need fibrate in the future as well.  4. Chest pain: At this time, it is unclear the cause of his chest pain. He does have risk factors for coronary disease including tobacco abuse, hypertension, and hyperlipidemia. His chest pain does sound quite atypical, but with his symptoms the day prior, it is possible that he was actually having cardiac ischemia during the 4 days of his chest pain. Due to that, we'll order a Lexiscan Myoview to further determine if he has any evidence of coronary disease.   5. Tobacco abuse: discussed smoking cessation  Current medicines are reviewed at length with the patient today.   The patient does not have concerns regarding his medicines.  The following changes were made today:  none  Labs/ tests ordered today include:  No orders of the defined types were placed in this encounter.    Disposition:   FU with Ishi Danser 3 months  Signed, Homero Hyson Jorja LoaMartin Rafe Mackowski, MD  08/30/2016 10:53 AM     [facility] [address] [place] [address] [phone] (office) [phone] (fax)

## 2016-08-30 ENCOUNTER — Encounter: Payer: Self-pay | Admitting: Cardiology

## 2016-08-30 ENCOUNTER — Encounter (INDEPENDENT_AMBULATORY_CARE_PROVIDER_SITE_OTHER): Payer: Self-pay

## 2016-08-30 ENCOUNTER — Ambulatory Visit (INDEPENDENT_AMBULATORY_CARE_PROVIDER_SITE_OTHER): Payer: Managed Care, Other (non HMO) | Admitting: Cardiology

## 2016-08-30 VITALS — BP 138/96 | HR 56 | Ht 71.0 in | Wt 195.0 lb

## 2016-08-30 DIAGNOSIS — R079 Chest pain, unspecified: Secondary | ICD-10-CM

## 2016-08-30 DIAGNOSIS — I471 Supraventricular tachycardia: Secondary | ICD-10-CM | POA: Diagnosis not present

## 2016-08-30 MED ORDER — ROSUVASTATIN CALCIUM 40 MG PO TABS: 40.0000 mg | ORAL_TABLET | Freq: Every day | ORAL | 3 refills | Status: DC

## 2016-08-30 NOTE — Patient Instructions (Signed)
Medication Instructions:    Your physician has recommended you make the following change in your medication:   1) INCREASE Crestor to 40 mg once daily  --- If you need a refill on your cardiac medications before your next appointment, please call your pharmacy. ---  Labwork:  None ordered  Testing/Procedures: Your physician has requested that you have a lexiscan myoview. For further information please visit [email or web address]. Please follow instruction sheet, as given.  Follow-Up:  Keep your currently scheduled follow up after your ablation.  Thank you for choosing [facility]!!   Dory HornSherri Esmae Donathan, RN [phone]  Any Other Special Instructions Will Be Listed Below (If Applicable)..  Pharmacologic Stress Electrocardiogram A pharmacologic stress electrocardiogram is a heart (cardiac) test that uses nuclear imaging to evaluate the blood supply to your heart. This test may also be called a pharmacologic stress electrocardiography. Pharmacologic means that a medicine is used to increase your heart rate and blood pressure.  This stress test is done to find areas of poor blood flow to the heart by determining the extent of coronary artery disease (CAD). Some people exercise on a treadmill, which naturally increases the blood flow to the heart. For those people unable to exercise on a treadmill, a medicine is used. This medicine stimulates your heart and will cause your heart to beat harder and more quickly, as if you were exercising.  Pharmacologic stress tests can help determine:  The adequacy of blood flow to your heart during increased levels of activity in order to clear you for discharge home.  The extent of coronary artery blockage caused by CAD.  Your prognosis if you have suffered a heart attack.  The effectiveness of cardiac procedures done, such as an angioplasty, which can increase the circulation in your coronary arteries.  Causes of chest pain or pressure. LET [facility]  [facility] PROVIDER KNOW ABOUT:  Any allergies you have.  All medicines you are taking, including vitamins, herbs, eye drops, creams, and over-the-counter medicines.  Previous problems you or members of your family have had with the use of anesthetics.  Any blood disorders you have.  Previous surgeries you have had.  Medical conditions you have.  Possibility of pregnancy, if this applies.  If you are currently breastfeeding. RISKS AND COMPLICATIONS Generally, this is a safe procedure. However, as with any procedure, complications can occur. Possible complications include:  You develop pain or pressure in the following areas:  Chest.  Jaw or neck.  Between your shoulder blades.  Radiating down your left arm.  Headache.  Dizziness or light-headedness.  Shortness of breath.  Increased or irregular heartbeat.  Low blood pressure.  Nausea or vomiting.  Flushing.  Redness going up the arm and slight pain during injection of medicine.  Heart attack (rare). BEFORE THE PROCEDURE   Avoid all forms of caffeine for 24 hours before your test or as directed by your health care provider. This includes coffee, tea (even decaffeinated tea), caffeinated sodas, chocolate, cocoa, and certain pain medicines.  Follow your health care provider's instructions regarding eating and drinking before the test.  Take your medicines as directed at regular times with water unless instructed otherwise. Exceptions may include:  If you have diabetes, ask how you are to take your insulin or pills. It is common to adjust insulin dosing the morning of the test.  If you are taking beta-blocker medicines, it is important to talk to your health care provider about these medicines well before the date of your test.  Taking beta-blocker medicines may interfere with the test. In some cases, these medicines need to be changed or stopped 24 hours or more before the test.  If you wear a nitroglycerin  patch, it may need to be removed prior to the test. Ask your health care provider if the patch should be removed before the test.  If you use an inhaler for any breathing condition, bring it with you to the test.  If you are an outpatient, bring a snack so you can eat right after the stress phase of the test.  Do not smoke for 4 hours prior to the test or as directed by your health care provider.  Do not apply lotions, powders, creams, or oils on your chest prior to the test.  Wear comfortable shoes and clothing. Let your health care provider know if you were unable to complete or follow the preparations for your test. PROCEDURE   Multiple patches (electrodes) will be put on your chest. If needed, small areas of your chest may be shaved to get better contact with the electrodes. Once the electrodes are attached to your body, multiple wires will be attached to the electrodes, and your heart rate will be monitored.  An IV access will be started. A nuclear trace (isotope) is given. The isotope may be given intravenously, or it may be swallowed. Nuclear refers to several types of radioactive isotopes, and the nuclear isotope lights up the arteries so that the nuclear images are clear. The isotope is absorbed by your body. This results in low radiation exposure.  A resting nuclear image is taken to show how your heart functions at rest.  A medicine is given through the IV access.  A second scan is done about 1 hour after the medicine injection and determines how your heart functions under stress.  During this stress phase, you will be connected to an electrocardiogram machine. Your blood pressure and oxygen levels will be monitored. AFTER THE PROCEDURE   Your heart rate and blood pressure will be monitored after the test.  You may return to your normal schedule, including diet,activities, and medicines, unless your health care provider tells you otherwise.   This information is not  intended to replace advice given to you by your health care provider. Make sure you discuss any questions you have with your health care provider.   Document Released: 03/04/2009 Document Revised: 10/21/2013 Document Reviewed: 06/23/2013 Elsevier Interactive Patient Education [employer]2016 Elsevier Inc.

## 2016-08-30 NOTE — Progress Notes (Signed)
Dr Ellsworth Lennoxejan-sie ordered those so we will defer to him.  The patient wanted us to send you a copy as well.

## 2016-08-31 NOTE — Telephone Encounter (Signed)
Max Dixon is returning your call . Thanks

## 2016-08-31 NOTE — Telephone Encounter (Signed)
Pt tells me that he has decided to continue 100 mg BID for now.  He wants to wait until after procedure before decreasing the medication.

## 2016-09-04 ENCOUNTER — Telehealth: Payer: Self-pay | Admitting: Cardiology

## 2016-09-04 NOTE — Telephone Encounter (Signed)
[name] is calling because she is needing an order for her husband to get a stress test . Please call

## 2016-09-04 NOTE — Telephone Encounter (Signed)
He is going to have it done at [facility] and their fax number is [phone] , please fax the order to them . Mrs. Max Dixon is asking that you call her once it has been faxed .

## 2016-09-04 NOTE — Telephone Encounter (Signed)
Informed wife order for lexi faxed to requested number.  She thanks me for helping.  It is cheaper for them to have testing done there.

## 2016-09-05 ENCOUNTER — Telehealth: Payer: Self-pay | Admitting: Cardiology

## 2016-09-05 NOTE — Telephone Encounter (Signed)
Gave phone number to hospital to discuss with billing department what out of pocket cost will be for procedure. She thanks me for helping.

## 2016-09-05 NOTE — Telephone Encounter (Signed)
New message     Wife calling need clarification was given a form to filled out for financial aid . [facility] - they would not be able to give him a discount for upcoming procedure..Marland Kitchen

## 2016-09-06 ENCOUNTER — Telehealth: Payer: Self-pay | Admitting: Cardiology

## 2016-09-06 NOTE — Telephone Encounter (Signed)
Spoke w/ our billing dept. They will call [facility] today w/ pre cert info.

## 2016-09-06 NOTE — Telephone Encounter (Signed)
Informed wife that pre cert taken care of. Confirmed w/ [facility]. She thanks me for helping.

## 2016-09-06 NOTE — Telephone Encounter (Signed)
New message  Order for pre-authorization for procedure on 11/13 has not been sent  Please follow up

## 2016-09-06 NOTE — Telephone Encounter (Signed)
ZOXW#[id number]#[id number] exp 12-05-16 for Lexiscan at [facility].

## 2016-09-07 ENCOUNTER — Telehealth: Payer: Self-pay | Admitting: Cardiology

## 2016-09-07 NOTE — Telephone Encounter (Signed)
Spoke w/ [facility]. Pt recently had GXT in July there.  Explained that we were under the impression that he was not able to ambulate on treadmill. Advised ok for pt to undergo GXT and not lexi. New order faxed to [facility]. Billing dept notified.

## 2016-09-07 NOTE — Telephone Encounter (Signed)
New Message:    Max Dixon needs clarifications of order for pt,pt is there now.

## 2016-09-11 ENCOUNTER — Encounter (HOSPITAL_COMMUNITY): Payer: Self-pay | Admitting: Cardiology

## 2016-09-11 ENCOUNTER — Encounter (HOSPITAL_COMMUNITY)
Admission: RE | Disposition: A | Discharge status: Home / Self Care | Payer: Self-pay | Source: Ambulatory Visit | Attending: Cardiology

## 2016-09-11 ENCOUNTER — Ambulatory Visit (HOSPITAL_COMMUNITY)
Admission: RE | Admit: 2016-09-11 | Discharge: 2016-09-11 | Disposition: A | Discharge status: Home / Self Care | Payer: Managed Care, Other (non HMO) | Source: Ambulatory Visit | Attending: Cardiology | Admitting: Cardiology

## 2016-09-11 DIAGNOSIS — I4719 Other supraventricular tachycardia: Secondary | ICD-10-CM | POA: Diagnosis present

## 2016-09-11 DIAGNOSIS — I471 Supraventricular tachycardia: Secondary | ICD-10-CM | POA: Diagnosis not present

## 2016-09-11 DIAGNOSIS — Z79899 Other long term (current) drug therapy: Secondary | ICD-10-CM | POA: Diagnosis not present

## 2016-09-11 HISTORY — PX: ELECTROPHYSIOLOGIC STUDY: SHX172A

## 2016-09-11 LAB — BASIC METABOLIC PANEL
ANION GAP: 10 (ref 5–15)
BUN: 8 mg/dL (ref 6–20)
CALCIUM: 9.3 mg/dL (ref 8.9–10.3)
CO2: 29 mmol/L (ref 22–32)
Chloride: 99 mmol/L — ABNORMAL LOW (ref 101–111)
Creatinine, Ser: 1.2 mg/dL (ref 0.61–1.24)
GFR calc Af Amer: >60 mL/min (ref >60)
Glucose, Bld: 82 mg/dL (ref 65–99)
POTASSIUM: 3.9 mmol/L (ref 3.5–5.1)
SODIUM: 138 mmol/L (ref 135–145)

## 2016-09-11 LAB — CBC
HCT: 45.5 % (ref 39.0–52.0)
Hemoglobin: 15.4 g/dL (ref 13.0–17.0)
MCH: 31 pg (ref 26.0–34.0)
MCHC: 33.8 g/dL (ref 30.0–36.0)
MCV: 91.5 fL (ref 78.0–100.0)
PLATELETS: 217 10*3/uL (ref 150–400)
RBC: 4.97 MIL/uL (ref 4.22–5.81)
RDW: 12.9 % (ref 11.5–15.5)
WBC: 12.6 10*3/uL — AB (ref 4.0–10.5)

## 2016-09-11 SURGERY — A-FLUTTER/A-TACH/SVT ABLATION

## 2016-09-11 MED ORDER — SODIUM CHLORIDE 0.9% FLUSH
3.0000 mL | Freq: Two times a day (BID) | INTRAVENOUS | Status: DC
Start: 2016-09-11 — End: 2016-09-11

## 2016-09-11 MED ORDER — FENTANYL CITRATE (PF) 100 MCG/2ML IJ SOLN
INTRAMUSCULAR | Status: DC | PRN
  Administered 2016-09-11 (×6): 25 ug via INTRAVENOUS

## 2016-09-11 MED ORDER — FENTANYL CITRATE (PF) 100 MCG/2ML IJ SOLN
INTRAMUSCULAR | Status: AC
  Filled 2016-09-11: qty 2

## 2016-09-11 MED ORDER — LISINOPRIL-HYDROCHLOROTHIAZIDE 20-12.5 MG PO TABS: 2.0000 | ORAL_TABLET | Freq: Every day | ORAL | Status: DC

## 2016-09-11 MED ORDER — HEPARIN (PORCINE) IN NACL 2-0.9 UNIT/ML-% IJ SOLN
INTRAMUSCULAR | Status: AC
  Filled 2016-09-11: qty 500

## 2016-09-11 MED ORDER — SODIUM CHLORIDE 0.9 % IV SOLN
INTRAVENOUS | Status: DC | PRN
  Administered 2016-09-11: 50 mL/h via INTRAVENOUS

## 2016-09-11 MED ORDER — IBUPROFEN 800 MG PO TABS: 800.0000 mg | ORAL_TABLET | Freq: Four times a day (QID) | ORAL | Status: DC | PRN

## 2016-09-11 MED ORDER — METOPROLOL TARTRATE 100 MG PO TABS: 100.0000 mg | ORAL_TABLET | Freq: Two times a day (BID) | ORAL | Status: DC

## 2016-09-11 MED ORDER — LEVOTHYROXINE SODIUM 75 MCG PO TABS
175.0000 ug | ORAL_TABLET | Freq: Every day | ORAL | Status: DC
  Filled 2016-09-11: qty 1

## 2016-09-11 MED ORDER — ISOPROTERENOL HCL 0.2 MG/ML IJ SOLN
INTRAVENOUS | Status: DC | PRN
  Administered 2016-09-11: 2 ug/min via INTRAVENOUS

## 2016-09-11 MED ORDER — SODIUM CHLORIDE 0.9% FLUSH: 3.0000 mL | INTRAVENOUS | Status: DC | PRN

## 2016-09-11 MED ORDER — MAGNESIUM OXIDE 400 (241.3 MG) MG PO TABS
400.0000 mg | ORAL_TABLET | Freq: Every day | ORAL | Status: DC
  Filled 2016-09-11: qty 1

## 2016-09-11 MED ORDER — BUPIVACAINE HCL (PF) 0.25 % IJ SOLN
INTRAMUSCULAR | Status: AC
  Filled 2016-09-11: qty 60

## 2016-09-11 MED ORDER — HYDROCHLOROTHIAZIDE 25 MG PO TABS
25.0000 mg | ORAL_TABLET | Freq: Every day | ORAL | Status: DC
  Filled 2016-09-11: qty 1

## 2016-09-11 MED ORDER — MOMETASONE FURO-FORMOTEROL FUM 100-5 MCG/ACT IN AERO
2.0000 | INHALATION_SPRAY | Freq: Two times a day (BID) | RESPIRATORY_TRACT | Status: DC
  Filled 2016-09-11: qty 8.8

## 2016-09-11 MED ORDER — SODIUM CHLORIDE 0.9 % IV SOLN: 250.0000 mL | INTRAVENOUS | Status: DC | PRN

## 2016-09-11 MED ORDER — BUPIVACAINE HCL (PF) 0.25 % IJ SOLN
INTRAMUSCULAR | Status: DC | PRN
  Administered 2016-09-11: 50 mL

## 2016-09-11 MED ORDER — ISOPROTERENOL HCL 0.2 MG/ML IJ SOLN
INTRAMUSCULAR | Status: AC
  Filled 2016-09-11: qty 5

## 2016-09-11 MED ORDER — HEPARIN (PORCINE) IN NACL 2-0.9 UNIT/ML-% IJ SOLN
INTRAMUSCULAR | Status: DC | PRN
  Administered 2016-09-11: 500 mL

## 2016-09-11 MED ORDER — ACETAMINOPHEN 325 MG PO TABS: 650.0000 mg | ORAL_TABLET | ORAL | Status: DC | PRN

## 2016-09-11 MED ORDER — FENOFIBRATE 160 MG PO TABS
160.0000 mg | ORAL_TABLET | Freq: Every day | ORAL | Status: DC
  Filled 2016-09-11: qty 1

## 2016-09-11 MED ORDER — MIDAZOLAM HCL 5 MG/5ML IJ SOLN
INTRAMUSCULAR | Status: AC
  Filled 2016-09-11: qty 5

## 2016-09-11 MED ORDER — LISINOPRIL 40 MG PO TABS
40.0000 mg | ORAL_TABLET | Freq: Every day | ORAL | Status: DC
  Filled 2016-09-11: qty 1

## 2016-09-11 MED ORDER — ONDANSETRON HCL 4 MG/2ML IJ SOLN: 4.0000 mg | Freq: Four times a day (QID) | INTRAMUSCULAR | Status: DC | PRN

## 2016-09-11 MED ORDER — MIDAZOLAM HCL 5 MG/5ML IJ SOLN
INTRAMUSCULAR | Status: DC | PRN
  Administered 2016-09-11 (×7): 1 mg via INTRAVENOUS

## 2016-09-11 MED ORDER — ROSUVASTATIN CALCIUM 10 MG PO TABS: 40.0000 mg | ORAL_TABLET | Freq: Every day | ORAL | Status: DC

## 2016-09-11 SURGICAL SUPPLY — 12 items
BAG SNAP BAND KOVER 36X36 (MISCELLANEOUS) ×3 IMPLANT
CATH EZ STEER NAV 4MM D-F CUR (ABLATOR) ×3 IMPLANT
CATH JOSEPH QUAD ALLRED 6F REP (CATHETERS) ×6 IMPLANT
CATH WEBSTER BI DIR CS D-F CRV (CATHETERS) ×3 IMPLANT
PACK EP LATEX FREE (CUSTOM PROCEDURE TRAY) ×2
PACK EP LF (CUSTOM PROCEDURE TRAY) ×1 IMPLANT
PAD DEFIB LIFELINK (PAD) ×3 IMPLANT
PATCH CARTO3 (PAD) ×3 IMPLANT
SHEATH PINNACLE 6F 10CM (SHEATH) ×6 IMPLANT
SHEATH PINNACLE 7F 10CM (SHEATH) ×3 IMPLANT
SHEATH PINNACLE 8F 10CM (SHEATH) ×3 IMPLANT
SHIELD RADPAD SCOOP 12X17 (MISCELLANEOUS) ×3 IMPLANT

## 2016-09-11 NOTE — Progress Notes (Signed)
Site area: rt groin Site Prior to Removal:  Level 0 Pressure Applied For:  15 minutes Manual:   yes Patient Status During Pull:  Stable  Post Pull Site:  Level 0 Post Pull Instructions Given:  yes Post Pull Pulses Present: yes Dressing Applied:  tegaderm/gauze Bedrest begins @ 1105 Comments:

## 2016-09-11 NOTE — Progress Notes (Signed)
Pt/family given discharge instructions, medication lists, follow up appointments, and when to call the doctor.  Pt/family verbalizes understanding. Pt given signs and symptoms of infection. Fatuma Dowers McClintock, RN    

## 2016-09-11 NOTE — Discharge Instructions (Signed)
No driving for 3 days. No lifting over 5 lbs for 1 week. No sexual activity for 1 week. You may return to work in 1 week.  Keep procedure site clean & dry. If you notice increased pain, swelling, bleeding or pus, call/return!  You may shower, but no soaking baths/hot tubs/pools for 1 week.  ° ° °

## 2016-09-11 NOTE — Progress Notes (Signed)
Site area: left groin sheaths pulled and pressure held by Sun[employer]agarP Site Prior to Removal:  Level 0 Pressure Applied For:  10 minutes Manual:   yes Patient Status During Pull:  stable Post Pull Site:  Level  0 Post Pull Instructions Given:  yes Post Pull Pulses Present: yes Dressing Applied:  tegaderm Bedrest begins @  Comments:

## 2016-09-11 NOTE — H&P (Signed)
Max ArgyleJames R Riviera has a history of SVT.  He converted with adenosine.  He presents for EP study and possible ablation of his SVT.  Risks and benefits discussed.  Risks include but not limited to bleeding, tamponade, heart block, stroke, among others.  He understands these risks and has agreed to the procedure.  Shivonne Schwartzman Elberta Fortisamnitz, MD 09/11/2016 7:04 AM

## 2016-09-26 ENCOUNTER — Encounter: Payer: Self-pay | Admitting: Cardiology

## 2016-10-04 ENCOUNTER — Encounter: Payer: Self-pay | Admitting: Cardiology

## 2016-10-04 ENCOUNTER — Ambulatory Visit (INDEPENDENT_AMBULATORY_CARE_PROVIDER_SITE_OTHER): Payer: Managed Care, Other (non HMO) | Admitting: Cardiology

## 2016-10-04 VITALS — BP 134/80 | HR 87 | Ht 71.0 in | Wt 192.4 lb

## 2016-10-04 DIAGNOSIS — I471 Supraventricular tachycardia: Secondary | ICD-10-CM

## 2016-10-04 NOTE — Patient Instructions (Signed)
Medication Instructions:  Your physician recommends that you continue on your current medications as directed. Please refer to the Current Medication list given to you today.  * If you need a refill on your cardiac medications before your next appointment, please call your pharmacy.   Labwork: None ordered  Testing/Procedures: None ordered  Follow-Up: No follow up is needed at this time with Dr. Camnitz.  He will see you on an as needed basis.   Thank you for choosing [facility]!!   Benedetto Ryder, RN [phone]     

## 2016-10-04 NOTE — Progress Notes (Signed)
Electrophysiology Office Note   Date:  10/04/2016   ID:  Max AloeJames R Gehl, DOB [birth year], MRN [id number]  PCP:  [facility]  Primary Electrophysiologist:  Will Jorja LoaMartin Camnitz, MD    Chief Complaint  Patient presents with  . Follow-up    SVT     History of Present Illness: Max Dixon is a 50 y.o. male who presents today for electrophysiology evaluation.   He presented to the emergency room with palpitations, and currently has an EP study scheduled for November 13. 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 He has had a 2 episodes of palpitations since his procedure, but these have been quite different from his tachycardia symptoms. He says that the most recent time that it happened, he was very upset prior to his episodes of palpitations.  Of note, he did have a stress test performed which showed no evidence of coronary disease.  Today, he denies symptoms of shortness of breath, orthopnea, PND, lower extremity edema, claudication, dizziness, presyncope, syncope, bleeding, or neurologic sequela. The patient is tolerating medications without difficulties and is otherwise without complaint today.    Past Medical History:  Diagnosis Date  . A-fib (HCC)   . Hyperlipidemia   . Hypertension    Past Surgical History:  Procedure Laterality Date  . ELECTROPHYSIOLOGIC STUDY N/A 09/11/2016   Procedure: SVT Ablation;  Surgeon: Will Jorja LoaMartin Camnitz, MD;  Location: [facility];  Service: Cardiovascular;  Laterality: N/A;     Current Outpatient Prescriptions  Medication Sig Dispense Refill  . budesonide-formoterol (SYMBICORT) 80-4.5 MCG/ACT inhaler Inhale 2 puffs into the lungs 2 (two) times daily as needed (shortness of breath).    . fenofibrate (TRICOR) 145 MG tablet Take 145 mg by mouth daily.  0  . ibuprofen (ADVIL,MOTRIN) 200 MG tablet Take 800 mg by mouth every 6 (six) hours as needed for headache or moderate pain.    Marland Kitchen. levothyroxine (SYNTHROID, LEVOTHROID) 175 MCG  tablet Take 175 mcg by mouth every morning.  0  . lisinopril-hydrochlorothiazide (PRINZIDE,ZESTORETIC) 20-12.5 MG tablet Take 2 tablets by mouth daily.  0  . magnesium oxide (MAG-OX) 400 MG tablet Take 400 mg by mouth daily.    . rosuvastatin (CRESTOR) 40 MG tablet Take 1 tablet (40 mg total) by mouth daily. 90 tablet 3   No current facility-administered medications for this visit.     Allergies:   Patient has no known allergies.   Social History:  The patient  reports that he has been smoking Cigarettes.  He has been smoking about 2.00 packs per day. He has never used smokeless tobacco. He reports that he does not drink alcohol or use drugs.   Family History:  The patient's family history is not on file.    ROS:  Please see the history of present illness.   Otherwise, review of systems is positive for none.   All other systems are reviewed and negative.    PHYSICAL EXAM: VS:  BP 134/80   Pulse 87   Ht 5\' 11"  (1.803 m)   Wt 192 lb 6.4 oz (87.3 kg)   BMI 26.83 kg/m  , BMI Body mass index is 26.83 kg/m. GEN: Well nourished, well developed, in no acute distress  HEENT: normal  Neck: no JVD, carotid bruits, or masses Cardiac: RRR; no murmurs, rubs, or gallops,no edema  Respiratory:  clear to auscultation bilaterally, normal work of breathing GI: soft, nontender, nondistended, + BS MS: no deformity or atrophy  Skin: warm and dry Neuro:  Strength and sensation are intact Psych: euthymic mood, full affect  EKG:  EKG is follow-up as needed ordered today. Personal review of the ekg ordered shows sinus rhythm, rate 87  EKG from earlier that day shows SVT with heart rates of 180.Marland Kitchen.      Recent Labs: 08/28/2016: ALT 28; TSH 7.300 09/11/2016: BUN 8; Creatinine, Ser 1.20; Hemoglobin 15.4; Platelets 217; Potassium 3.9; Sodium 138    Lipid Panel     Component Value Date/Time   CHOL 205 (H) 08/28/2016 0800   TRIG 664 (HH) 08/28/2016 0800   HDL 25 (L) 08/28/2016 0800   CHOLHDL 8.2  (H) 08/28/2016 0800   LDLCALC Comment 08/28/2016 0800     Wt Readings from Last 3 Encounters:  10/04/16 192 lb 6.4 oz (87.3 kg)  09/11/16 185 lb (83.9 kg)  08/30/16 195 lb (88.5 kg)      Other studies Reviewed: Additional studies/ records that were reviewed today include: TTE 06/02/16  Review of the above records today demonstrates:  Ejection fraction 67%, grade 3 diastolic dysfunction, heart rate 170+, left atrium 42 mm mild mitral regurgitation and mild tricuspid regurgitation   ASSESSMENT AND PLAN:  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 No further episodes of tachycardia. He has had palpitations twice. I told him that if he continues to have palpitations to call us and we will place a 30 day monitor.  2. Hypertension: Well-controlled  3. Hyperlipidemia:  On check of his lipids, triglycerides were greatly elevated.  Will need to switch from zocor to crestor 40. May need fibrate in the future as well.  4. Chest pain: Myoview from outside hospital shows no evidence of coronary disease.   5. Tobacco abuse: discussed smoking cessation  Current medicines are reviewed at length with the patient today.   The patient does not have concerns regarding his medicines.  The following changes were made today:  none  Labs/ tests ordered today include:  No orders of the defined types were placed in this encounter.    Disposition:   FU with Will Camnitz 3 months  Signed, Will Jorja LoaMartin Camnitz, MD  10/04/2016 9:21 AM     [facility] [address] [place] [address] [phone] (office) [phone] (fax)

## 2016-10-06 ENCOUNTER — Encounter: Payer: Self-pay | Admitting: Emergency Medicine

## 2016-10-06 ENCOUNTER — Emergency Department: Payer: Managed Care, Other (non HMO)

## 2016-10-06 ENCOUNTER — Inpatient Hospital Stay
Admission: EM | Admit: 2016-10-06 | Discharge: 2016-10-08 | DRG: 416 | Disposition: A | Discharge status: Home / Self Care | Payer: Managed Care, Other (non HMO) | Attending: General Surgery | Admitting: General Surgery

## 2016-10-06 DIAGNOSIS — E669 Obesity, unspecified: Secondary | ICD-10-CM | POA: Diagnosis present

## 2016-10-06 DIAGNOSIS — F17210 Nicotine dependence, cigarettes, uncomplicated: Secondary | ICD-10-CM | POA: Diagnosis present

## 2016-10-06 DIAGNOSIS — K810 Acute cholecystitis: Secondary | ICD-10-CM

## 2016-10-06 DIAGNOSIS — I509 Heart failure, unspecified: Secondary | ICD-10-CM | POA: Diagnosis present

## 2016-10-06 DIAGNOSIS — K5900 Constipation, unspecified: Secondary | ICD-10-CM | POA: Diagnosis present

## 2016-10-06 DIAGNOSIS — Z7951 Long term (current) use of inhaled steroids: Secondary | ICD-10-CM | POA: Diagnosis not present

## 2016-10-06 DIAGNOSIS — R079 Chest pain, unspecified: Secondary | ICD-10-CM | POA: Diagnosis present

## 2016-10-06 DIAGNOSIS — I110 Hypertensive heart disease with heart failure: Secondary | ICD-10-CM | POA: Diagnosis present

## 2016-10-06 DIAGNOSIS — R1011 Right upper quadrant pain: Secondary | ICD-10-CM | POA: Diagnosis present

## 2016-10-06 DIAGNOSIS — Z791 Long term (current) use of non-steroidal anti-inflammatories (NSAID): Secondary | ICD-10-CM | POA: Diagnosis not present

## 2016-10-06 DIAGNOSIS — K760 Fatty (change of) liver, not elsewhere classified: Secondary | ICD-10-CM | POA: Diagnosis present

## 2016-10-06 DIAGNOSIS — I4891 Unspecified atrial fibrillation: Secondary | ICD-10-CM | POA: Diagnosis present

## 2016-10-06 DIAGNOSIS — K660 Peritoneal adhesions (postprocedural) (postinfection): Secondary | ICD-10-CM | POA: Diagnosis present

## 2016-10-06 DIAGNOSIS — Z79899 Other long term (current) drug therapy: Secondary | ICD-10-CM | POA: Diagnosis not present

## 2016-10-06 DIAGNOSIS — E785 Hyperlipidemia, unspecified: Secondary | ICD-10-CM | POA: Diagnosis present

## 2016-10-06 DIAGNOSIS — K420 Umbilical hernia with obstruction, without gangrene: Secondary | ICD-10-CM

## 2016-10-06 DIAGNOSIS — K819 Cholecystitis, unspecified: Secondary | ICD-10-CM | POA: Diagnosis not present

## 2016-10-06 DIAGNOSIS — Z6826 Body mass index (BMI) 26.0-26.9, adult: Secondary | ICD-10-CM | POA: Diagnosis not present

## 2016-10-06 DIAGNOSIS — K8000 Calculus of gallbladder with acute cholecystitis without obstruction: Secondary | ICD-10-CM | POA: Diagnosis present

## 2016-10-06 HISTORY — DX: Heart failure, unspecified: I50.9

## 2016-10-06 HISTORY — DX: Acute cholecystitis: K81.0

## 2016-10-06 LAB — BASIC METABOLIC PANEL
ANION GAP: 8 (ref 5–15)
BUN: 5 mg/dL — ABNORMAL LOW (ref 6–20)
CALCIUM: 9.4 mg/dL (ref 8.9–10.3)
CHLORIDE: 95 mmol/L — AB (ref 101–111)
CO2: 31 mmol/L (ref 22–32)
Creatinine, Ser: 1.22 mg/dL (ref 0.61–1.24)
GFR calc non Af Amer: >60 mL/min (ref >60)
Glucose, Bld: 112 mg/dL — ABNORMAL HIGH (ref 65–99)
Potassium: 3.8 mmol/L (ref 3.5–5.1)
Sodium: 134 mmol/L — ABNORMAL LOW (ref 135–145)

## 2016-10-06 LAB — HEPATIC FUNCTION PANEL
ALK PHOS: 58 U/L (ref 38–126)
ALT: 30 U/L (ref 17–63)
AST: 24 U/L (ref 15–41)
Albumin: 4.3 g/dL (ref 3.5–5.0)
BILIRUBIN TOTAL: 1.1 mg/dL (ref 0.3–1.2)
Bilirubin, Direct: <0.1 mg/dL — ABNORMAL LOW (ref 0.1–0.5)
Total Protein: 8.3 g/dL — ABNORMAL HIGH (ref 6.5–8.1)

## 2016-10-06 LAB — CBC
HCT: 47 % (ref 40.0–52.0)
HEMOGLOBIN: 16.2 g/dL (ref 13.0–18.0)
MCH: 31.4 pg (ref 26.0–34.0)
MCHC: 34.4 g/dL (ref 32.0–36.0)
MCV: 91.3 fL (ref 80.0–100.0)
Platelets: 251 10*3/uL (ref 150–440)
RBC: 5.15 MIL/uL (ref 4.40–5.90)
RDW: 12.8 % (ref 11.5–14.5)
WBC: 18.9 10*3/uL — ABNORMAL HIGH (ref 3.8–10.6)

## 2016-10-06 LAB — TROPONIN I

## 2016-10-06 MED ORDER — IOPAMIDOL (ISOVUE-300) INJECTION 61%
100.0000 mL | Freq: Once | INTRAVENOUS | Status: AC | PRN
  Administered 2016-10-06: 100 mL via INTRAVENOUS

## 2016-10-06 MED ORDER — HYDROMORPHONE HCL 1 MG/ML IJ SOLN
1.0000 mg | Freq: Once | INTRAMUSCULAR | Status: AC
  Administered 2016-10-06: 1 mg via INTRAVENOUS
  Filled 2016-10-06: qty 1

## 2016-10-06 MED ORDER — IOPAMIDOL (ISOVUE-300) INJECTION 61%
30.0000 mL | Freq: Once | INTRAVENOUS | Status: AC | PRN
  Administered 2016-10-06: 30 mL via ORAL

## 2016-10-06 MED ORDER — MORPHINE SULFATE (PF) 4 MG/ML IV SOLN
4.0000 mg | Freq: Once | INTRAVENOUS | Status: AC
  Administered 2016-10-06: 4 mg via INTRAVENOUS
  Filled 2016-10-06: qty 1

## 2016-10-06 MED ORDER — PIPERACILLIN-TAZOBACTAM 3.375 G IVPB
3.3750 g | Freq: Once | INTRAVENOUS | Status: AC
  Administered 2016-10-06: 3.375 g via INTRAVENOUS
  Filled 2016-10-06: qty 50

## 2016-10-06 MED ORDER — SODIUM CHLORIDE 0.9 % IV SOLN
Freq: Once | INTRAVENOUS | Status: AC
  Administered 2016-10-06: 21:00:00 via INTRAVENOUS

## 2016-10-06 NOTE — ED Notes (Signed)
Pt returned from ultrasound

## 2016-10-06 NOTE — ED Notes (Signed)
Patient reports last bowel movement was four days ago. Reports no relief with suppository use, or laxitives at home. States he started taking Crestor and Tricor approx. 1 month ago but no other medication or diet changes.

## 2016-10-06 NOTE — ED Triage Notes (Signed)
Pt ambulatory to triage slowly, reports constipation, last BM 4 days ago.  Reports pain in chest and lower back.  Pt NAD at this time.

## 2016-10-06 NOTE — ED Notes (Signed)
Patient transported to Ultrasound 

## 2016-10-06 NOTE — ED Notes (Signed)
Dr. Tonita Congwoodham here to see pt.

## 2016-10-06 NOTE — ED Notes (Signed)
Pt updated on plan of care. Pt verbalizes understanding.  

## 2016-10-06 NOTE — ED Notes (Signed)
Report from laura, rn 

## 2016-10-06 NOTE — ED Provider Notes (Signed)
[facility] Emergency Department Provider Note        Time seen: ----------------------------------------- 8:16 PM on 10/06/2016 -----------------------------------------    I have reviewed the triage vital signs and the nursing notes.   HISTORY  Chief Complaint Constipation and Chest Pain    HPI Max Dixon is a 50 y.o. male who presents to the ER for abdominal pain. Patient reports she is constipated with his last bowel movement 4 days ago. He reports pain in the right lower chest and back. He denies fevers, chills, shortness of breath, vomiting or diarrhea. He has not had any relief with suppository use.   Past Medical History:  Diagnosis Date  . A-fib (HCC)   . Hyperlipidemia   . Hypertension     Patient Active Problem List   Diagnosis Date Noted  . 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 ELECTROPHYSIOLOGIC STUDY N/A 09/11/2016   Procedure: SVT Ablation;  Surgeon: Will Jorja LoaMartin Camnitz, MD;  Location: [facility] INVASIVE CV LAB;  Service: Cardiovascular;  Laterality: N/A;    Allergies Patient has no known allergies.  Social History Social History  Substance Use Topics  . Smoking status: Current Every Day Smoker    Packs/day: 2.00    Types: Cigarettes  . Smokeless tobacco: Never Used  . Alcohol use No    Review of Systems Constitutional: Negative for fever. Cardiovascular: Positive for chest pain Respiratory: Negative for shortness of breath. Gastrointestinal: Positive for abdominal pain, constipation Genitourinary: Negative for dysuria. Musculoskeletal: Negative for back pain. Skin: Negative for rash. Neurological: Negative for headaches, focal weakness or numbness.  10-point ROS otherwise negative.  ____________________________________________   PHYSICAL EXAM:  VITAL SIGNS: ED Triage Vitals  Enc Vitals Group     BP 10/06/16 1912 (!) 164/92      Pulse Rate 10/06/16 1912 (!) 122     Resp 10/06/16 1912 18     Temp 10/06/16 1912 98.3 F (36.8 C)     Temp Source 10/06/16 1912 Oral     SpO2 10/06/16 1912 95 %     Weight 10/06/16 1914 193 lb (87.5 kg)     Height 10/06/16 1914 5\' 11"  (1.803 m)     Head Circumference --      Peak Flow --      Pain Score 10/06/16 1914 10     Pain Loc --      Pain Edu? --      Excl. in GC? --     Constitutional: Alert and oriented. Mild distress Eyes: Conjunctivae are normal. PERRL. Normal extraocular movements. ENT   Head: Normocephalic and atraumatic.   Nose: No congestion/rhinnorhea.   Mouth/Throat: Mucous membranes are moist.   Neck: No stridor. Cardiovascular: Normal rate, regular rhythm. No murmurs, rubs, or gallops. Respiratory: Normal respiratory effort without tachypnea nor retractions. Breath sounds are clear and equal bilaterally. No wheezes/rales/rhonchi. Gastrointestinal: Distended, diffuse abdominal tenderness, worse on the right. Hypoactive bowel sounds. Musculoskeletal: Nontender with normal range of motion in all extremities. No lower extremity tenderness nor edema. Neurologic:  Normal speech and language. No gross focal neurologic deficits are appreciated.  Skin:  Skin is warm, dry and intact. No rash noted. Psychiatric: Mood and affect are normal. Speech and behavior are normal.  ____________________________________________  EKG: Interpreted by me. Sinus tachycardia with a rate of 117 bpm, normal PR interval, normal QRS width, normal QT, incomplete right bundle branch block  ____________________________________________  ED COURSE:  Pertinent labs & imaging results that were available during my care of the patient were reviewed by me and considered in my medical decision making (see chart for details). Clinical Course   Patient presents to the ER with a very tender abdomen, worse in the right. We will assess with labs and likely CT  imaging.  Procedures ____________________________________________   LABS (pertinent positives/negatives)  Labs Reviewed  BASIC METABOLIC PANEL - Abnormal; Notable for the following:       Result Value   Sodium 134 (*)    Chloride 95 (*)    Glucose, Bld 112 (*)    BUN 5 (*)    All other components within normal limits  CBC - Abnormal; Notable for the following:    WBC 18.9 (*)    All other components within normal limits  HEPATIC FUNCTION PANEL - Abnormal; Notable for the following:    Total Protein 8.3 (*)    Bilirubin, Direct <0.1 (*)    All other components within normal limits  TROPONIN I    RADIOLOGY Images were viewed by me  CT abdomen and pelvis with contrast  IMPRESSION: 1. Cholelithiasis. Mild stranding around the gallbladder, raising the question of cholecystitis. Right upper quadrant ultrasound recommended. 2. Normal appendix. Mild fecalization of small bowel content without obstruction.  ____________________________________________  FINAL ASSESSMENT AND PLAN   Abdominal pain, Likely cholecystitis   Plan: Patient with labs and imaging as dictated above. Patient presents to the ER in mild distress, findings concerning on CT for cholecystitis. I will discuss with general surgery, order IV Zosyn. Patient is requesting more IV narcotics for pain.   Emily FilbertWilliams, Rehman Levinson E, MD   Note: This dictation was prepared with Dragon dictation. Any transcriptional errors that result from this process are unintentional    Emily FilbertJonathan E Burley Kopka, MD 10/06/16 2215

## 2016-10-06 NOTE — ED Notes (Signed)
Pt placed on oxygen at 2lpm via [place] for pox of 88% while sleeping after dilaudid administration. Pox rebounding to 94% after oxygen administration.

## 2016-10-07 ENCOUNTER — Inpatient Hospital Stay: Payer: Managed Care, Other (non HMO) | Admitting: Registered Nurse

## 2016-10-07 ENCOUNTER — Encounter: Payer: Self-pay | Admitting: *Deleted

## 2016-10-07 ENCOUNTER — Encounter
Admission: EM | Disposition: A | Discharge status: Home / Self Care | Payer: Self-pay | Source: Home / Self Care | Attending: General Surgery

## 2016-10-07 ENCOUNTER — Inpatient Hospital Stay: Payer: Managed Care, Other (non HMO)

## 2016-10-07 DIAGNOSIS — K420 Umbilical hernia with obstruction, without gangrene: Secondary | ICD-10-CM

## 2016-10-07 DIAGNOSIS — K810 Acute cholecystitis: Secondary | ICD-10-CM

## 2016-10-07 DIAGNOSIS — K819 Cholecystitis, unspecified: Secondary | ICD-10-CM

## 2016-10-07 HISTORY — PX: CHOLECYSTECTOMY: SHX55

## 2016-10-07 LAB — CBC
HCT: 43.6 % (ref 40.0–52.0)
HEMOGLOBIN: 15.1 g/dL (ref 13.0–18.0)
MCH: 31.6 pg (ref 26.0–34.0)
MCHC: 34.6 g/dL (ref 32.0–36.0)
MCV: 91.3 fL (ref 80.0–100.0)
PLATELETS: 211 10*3/uL (ref 150–440)
RBC: 4.78 MIL/uL (ref 4.40–5.90)
RDW: 13 % (ref 11.5–14.5)
WBC: 15.2 10*3/uL — AB (ref 3.8–10.6)

## 2016-10-07 LAB — COMPREHENSIVE METABOLIC PANEL
ALBUMIN: 3.9 g/dL (ref 3.5–5.0)
ALK PHOS: 70 U/L (ref 38–126)
ALT: 55 U/L (ref 17–63)
AST: 60 U/L — AB (ref 15–41)
Anion gap: 7 (ref 5–15)
BUN: 6 mg/dL (ref 6–20)
CHLORIDE: 98 mmol/L — AB (ref 101–111)
CO2: 31 mmol/L (ref 22–32)
CREATININE: 1.22 mg/dL (ref 0.61–1.24)
Calcium: 8.8 mg/dL — ABNORMAL LOW (ref 8.9–10.3)
GFR calc Af Amer: >60 mL/min (ref >60)
GFR calc non Af Amer: >60 mL/min (ref >60)
GLUCOSE: 117 mg/dL — AB (ref 65–99)
Potassium: 4 mmol/L (ref 3.5–5.1)
SODIUM: 136 mmol/L (ref 135–145)
Total Bilirubin: 0.8 mg/dL (ref 0.3–1.2)
Total Protein: 7.6 g/dL (ref 6.5–8.1)

## 2016-10-07 LAB — MRSA PCR SCREENING: MRSA by PCR: NEGATIVE

## 2016-10-07 SURGERY — LAPAROSCOPIC CHOLECYSTECTOMY WITH INTRAOPERATIVE CHOLANGIOGRAM
Anesthesia: General

## 2016-10-07 MED ORDER — ONDANSETRON HCL 4 MG/2ML IJ SOLN
INTRAMUSCULAR | Status: DC | PRN
  Administered 2016-10-07: 4 mg via INTRAVENOUS

## 2016-10-07 MED ORDER — ONDANSETRON HCL 4 MG/2ML IJ SOLN: 4.0000 mg | Freq: Four times a day (QID) | INTRAMUSCULAR | Status: DC | PRN

## 2016-10-07 MED ORDER — PIPERACILLIN-TAZOBACTAM 3.375 G IVPB
3.3750 g | Freq: Three times a day (TID) | INTRAVENOUS | Status: AC
  Administered 2016-10-07 – 2016-10-08 (×3): 3.375 g via INTRAVENOUS
  Filled 2016-10-07 (×3): qty 50

## 2016-10-07 MED ORDER — PIPERACILLIN-TAZOBACTAM 3.375 G IVPB
3.3750 g | Freq: Three times a day (TID) | INTRAVENOUS | Status: DC
  Administered 2016-10-07 (×2): 3.375 g via INTRAVENOUS
  Filled 2016-10-07 (×2): qty 50

## 2016-10-07 MED ORDER — LIDOCAINE HCL (CARDIAC) 20 MG/ML IV SOLN
INTRAVENOUS | Status: DC | PRN
Start: 2016-10-07 — End: 2016-10-07
  Administered 2016-10-07: 100 mg via INTRAVENOUS

## 2016-10-07 MED ORDER — MOMETASONE FURO-FORMOTEROL FUM 100-5 MCG/ACT IN AERO
2.0000 | INHALATION_SPRAY | Freq: Two times a day (BID) | RESPIRATORY_TRACT | Status: DC
  Filled 2016-10-07: qty 8.8

## 2016-10-07 MED ORDER — IPRATROPIUM-ALBUTEROL 0.5-2.5 (3) MG/3ML IN SOLN
3.0000 mL | Freq: Once | RESPIRATORY_TRACT | Status: AC
  Administered 2016-10-07: 3 mL via RESPIRATORY_TRACT

## 2016-10-07 MED ORDER — CHLORHEXIDINE GLUCONATE CLOTH 2 % EX PADS: 6.0000 | MEDICATED_PAD | Freq: Once | CUTANEOUS | Status: DC

## 2016-10-07 MED ORDER — SODIUM CHLORIDE 0.9 % IJ SOLN
INTRAMUSCULAR | Status: AC
  Filled 2016-10-07: qty 50

## 2016-10-07 MED ORDER — LISINOPRIL-HYDROCHLOROTHIAZIDE 20-12.5 MG PO TABS: 2.0000 | ORAL_TABLET | Freq: Every day | ORAL | Status: DC

## 2016-10-07 MED ORDER — HYDROCHLOROTHIAZIDE 25 MG PO TABS
25.0000 mg | ORAL_TABLET | Freq: Every day | ORAL | Status: DC
  Administered 2016-10-07 – 2016-10-08 (×2): 25 mg via ORAL
  Filled 2016-10-07 (×3): qty 1

## 2016-10-07 MED ORDER — KETOROLAC TROMETHAMINE 30 MG/ML IJ SOLN
30.0000 mg | Freq: Four times a day (QID) | INTRAMUSCULAR | Status: DC
  Administered 2016-10-07 – 2016-10-08 (×4): 30 mg via INTRAVENOUS
  Filled 2016-10-07 (×4): qty 1

## 2016-10-07 MED ORDER — BUPIVACAINE-EPINEPHRINE (PF) 0.25% -1:200000 IJ SOLN
INTRAMUSCULAR | Status: AC
  Filled 2016-10-07: qty 30

## 2016-10-07 MED ORDER — KETOROLAC TROMETHAMINE 30 MG/ML IJ SOLN
INTRAMUSCULAR | Status: DC | PRN
  Administered 2016-10-07: 30 mg via INTRAVENOUS

## 2016-10-07 MED ORDER — LACTATED RINGERS IV SOLN
INTRAVENOUS | Status: DC
  Administered 2016-10-07 – 2016-10-08 (×4): via INTRAVENOUS

## 2016-10-07 MED ORDER — IOPAMIDOL (ISOVUE-300) INJECTION 61%
INTRAVENOUS | Status: DC | PRN
  Administered 2016-10-07: 15 mL via INTRAVENOUS

## 2016-10-07 MED ORDER — LACTATED RINGERS IV SOLN
INTRAVENOUS | Status: DC | PRN
  Administered 2016-10-07: 12:00:00 via INTRAVENOUS

## 2016-10-07 MED ORDER — ACETAMINOPHEN 10 MG/ML IV SOLN
INTRAVENOUS | Status: DC | PRN
  Administered 2016-10-07: 1000 mg via INTRAVENOUS

## 2016-10-07 MED ORDER — ROCURONIUM BROMIDE 100 MG/10ML IV SOLN
INTRAVENOUS | Status: DC | PRN
  Administered 2016-10-07: 40 mg via INTRAVENOUS
  Administered 2016-10-07: 10 mg via INTRAVENOUS
  Administered 2016-10-07: 20 mg via INTRAVENOUS
  Administered 2016-10-07 (×2): 10 mg via INTRAVENOUS

## 2016-10-07 MED ORDER — HYDRALAZINE HCL 20 MG/ML IJ SOLN: 10.0000 mg | INTRAMUSCULAR | Status: DC | PRN

## 2016-10-07 MED ORDER — PROPOFOL 10 MG/ML IV BOLUS
INTRAVENOUS | Status: DC | PRN
  Administered 2016-10-07: 150 mg via INTRAVENOUS

## 2016-10-07 MED ORDER — ACETAMINOPHEN 10 MG/ML IV SOLN
INTRAVENOUS | Status: AC
  Filled 2016-10-07: qty 100

## 2016-10-07 MED ORDER — BUPIVACAINE-EPINEPHRINE 0.25% -1:200000 IJ SOLN
INTRAMUSCULAR | Status: DC | PRN
  Administered 2016-10-07: 30 mL

## 2016-10-07 MED ORDER — NICOTINE 21 MG/24HR TD PT24
21.0000 mg | MEDICATED_PATCH | Freq: Every day | TRANSDERMAL | Status: DC
  Administered 2016-10-07 – 2016-10-08 (×2): 21 mg via TRANSDERMAL
  Filled 2016-10-07 (×2): qty 1

## 2016-10-07 MED ORDER — ONDANSETRON HCL 4 MG/2ML IJ SOLN: 4.0000 mg | Freq: Once | INTRAMUSCULAR | Status: DC | PRN

## 2016-10-07 MED ORDER — FENTANYL CITRATE (PF) 100 MCG/2ML IJ SOLN
25.0000 ug | INTRAMUSCULAR | Status: DC | PRN
  Administered 2016-10-07 (×2): 50 ug via INTRAVENOUS
  Administered 2016-10-07: 100 ug via INTRAVENOUS
  Filled 2016-10-07 (×4): qty 0.5

## 2016-10-07 MED ORDER — SUGAMMADEX SODIUM 500 MG/5ML IV SOLN
INTRAVENOUS | Status: DC | PRN
  Administered 2016-10-07: 350 mg via INTRAVENOUS

## 2016-10-07 MED ORDER — DEXAMETHASONE SODIUM PHOSPHATE 10 MG/ML IJ SOLN
INTRAMUSCULAR | Status: DC | PRN
  Administered 2016-10-07: 4 mg via INTRAVENOUS

## 2016-10-07 MED ORDER — DIPHENHYDRAMINE HCL 25 MG PO CAPS: 25.0000 mg | ORAL_CAPSULE | Freq: Four times a day (QID) | ORAL | Status: DC | PRN

## 2016-10-07 MED ORDER — IPRATROPIUM-ALBUTEROL 0.5-2.5 (3) MG/3ML IN SOLN
RESPIRATORY_TRACT | Status: AC
  Administered 2016-10-07: 3 mL via RESPIRATORY_TRACT
  Filled 2016-10-07: qty 3

## 2016-10-07 MED ORDER — PHENYLEPHRINE HCL 10 MG/ML IJ SOLN
INTRAMUSCULAR | Status: DC | PRN
  Administered 2016-10-07: 200 ug via INTRAVENOUS
  Administered 2016-10-07: 150 ug via INTRAVENOUS
  Administered 2016-10-07: 300 ug via INTRAVENOUS
  Administered 2016-10-07 (×2): 200 ug via INTRAVENOUS
  Administered 2016-10-07: 300 ug via INTRAVENOUS
  Administered 2016-10-07 (×2): 200 ug via INTRAVENOUS

## 2016-10-07 MED ORDER — DIPHENHYDRAMINE HCL 50 MG/ML IJ SOLN: 25.0000 mg | Freq: Four times a day (QID) | INTRAMUSCULAR | Status: DC | PRN

## 2016-10-07 MED ORDER — SODIUM CHLORIDE 0.9 % IV SOLN
INTRAVENOUS | Status: DC | PRN
  Administered 2016-10-07: 70 mL

## 2016-10-07 MED ORDER — MIDAZOLAM HCL 2 MG/2ML IJ SOLN
INTRAMUSCULAR | Status: DC | PRN
  Administered 2016-10-07: 2 mg via INTRAVENOUS

## 2016-10-07 MED ORDER — OXYCODONE-ACETAMINOPHEN 7.5-325 MG PO TABS: 2.0000 | ORAL_TABLET | ORAL | Status: DC | PRN

## 2016-10-07 MED ORDER — HYDROMORPHONE HCL 1 MG/ML IJ SOLN
1.0000 mg | INTRAMUSCULAR | Status: DC | PRN
  Administered 2016-10-07 (×2): 1 mg via INTRAVENOUS
  Filled 2016-10-07 (×2): qty 1

## 2016-10-07 MED ORDER — LISINOPRIL 20 MG PO TABS
40.0000 mg | ORAL_TABLET | Freq: Every day | ORAL | Status: DC
  Administered 2016-10-07 – 2016-10-08 (×2): 40 mg via ORAL
  Filled 2016-10-07 (×2): qty 2

## 2016-10-07 MED ORDER — DOCUSATE SODIUM 100 MG PO CAPS
100.0000 mg | ORAL_CAPSULE | Freq: Two times a day (BID) | ORAL | Status: DC
  Administered 2016-10-07 – 2016-10-08 (×2): 100 mg via ORAL
  Filled 2016-10-07 (×2): qty 1

## 2016-10-07 MED ORDER — ONDANSETRON 4 MG PO TBDP: 4.0000 mg | ORAL_TABLET | Freq: Four times a day (QID) | ORAL | Status: DC | PRN

## 2016-10-07 MED ORDER — BUPIVACAINE LIPOSOME 1.3 % IJ SUSP
INTRAMUSCULAR | Status: AC
  Filled 2016-10-07: qty 20

## 2016-10-07 MED ORDER — METOPROLOL TARTRATE 5 MG/5ML IV SOLN
INTRAVENOUS | Status: DC | PRN
  Administered 2016-10-07 (×2): 2 mg via INTRAVENOUS

## 2016-10-07 SURGICAL SUPPLY — 56 items
APPLICATOR COTTON TIP 6IN STRL (MISCELLANEOUS) ×3 IMPLANT
APPLIER CLIP 5 13 M/L LIGAMAX5 (MISCELLANEOUS) ×3
APPLIER CLIP LOGIC TI 5 (MISCELLANEOUS) ×3 IMPLANT
BLADE SURG 15 STRL LF DISP TIS (BLADE) ×1 IMPLANT
BLADE SURG 15 STRL SS (BLADE) ×2
BULB RESERV EVAC DRAIN JP 100C (MISCELLANEOUS) ×3 IMPLANT
CANISTER SUCT 1200ML W/VALVE (MISCELLANEOUS) ×3 IMPLANT
CATH REDDICK CHOLANGI 4FR 50CM (CATHETERS) ×3 IMPLANT
CHLORAPREP W/TINT 26ML (MISCELLANEOUS) ×3 IMPLANT
CHOLANGIOGRAM CATH TAUT (CATHETERS) ×3 IMPLANT
CLEANER CAUTERY TIP 5X5 PAD (MISCELLANEOUS) ×1 IMPLANT
CLIP APPLIE 5 13 M/L LIGAMAX5 (MISCELLANEOUS) ×1 IMPLANT
DECANTER SPIKE VIAL GLASS SM (MISCELLANEOUS) IMPLANT
DERMABOND ADVANCED (GAUZE/BANDAGES/DRESSINGS) ×2
DERMABOND ADVANCED .7 DNX12 (GAUZE/BANDAGES/DRESSINGS) ×1 IMPLANT
DEVICE TROCAR PUNCTURE CLOSURE (ENDOMECHANICALS) IMPLANT
DRAIN CHANNEL JP 19F (MISCELLANEOUS) ×3 IMPLANT
DRAPE C-ARM XRAY 36X54 (DRAPES) ×3 IMPLANT
DRAPE INCISE IOBAN 66X45 STRL (DRAPES) ×3 IMPLANT
ELECT CAUTERY BLADE 6.4 (BLADE) ×3 IMPLANT
ELECT REM PT RETURN 9FT ADLT (ELECTROSURGICAL) ×3
ELECTRODE REM PT RTRN 9FT ADLT (ELECTROSURGICAL) ×1 IMPLANT
ENDOLOOP SUT PDS II  0 18 (SUTURE) ×2
ENDOLOOP SUT PDS II 0 18 (SUTURE) ×1 IMPLANT
ENDOPOUCH RETRIEVER 10 (MISCELLANEOUS) ×3 IMPLANT
GELPORT LAPAROSCOPIC (MISCELLANEOUS) ×3 IMPLANT
GLOVE BIO SURGEON STRL SZ7 (GLOVE) ×3 IMPLANT
GOWN STRL REUS W/ TWL LRG LVL3 (GOWN DISPOSABLE) ×3 IMPLANT
GOWN STRL REUS W/TWL LRG LVL3 (GOWN DISPOSABLE) ×6
IRRIGATION STRYKERFLOW (MISCELLANEOUS) ×1 IMPLANT
IRRIGATOR STRYKERFLOW (MISCELLANEOUS) ×3
IV CATH ANGIO 12GX3 LT BLUE (NEEDLE) ×3 IMPLANT
IV NS 1000ML (IV SOLUTION) ×2
IV NS 1000ML BAXH (IV SOLUTION) ×1 IMPLANT
L-HOOK LAP DISP 36CM (ELECTROSURGICAL) ×3
LHOOK LAP DISP 36CM (ELECTROSURGICAL) ×1 IMPLANT
LIQUID BAND (GAUZE/BANDAGES/DRESSINGS) ×3 IMPLANT
NEEDLE HYPO 22GX1.5 SAFETY (NEEDLE) ×3 IMPLANT
PACK LAP CHOLECYSTECTOMY (MISCELLANEOUS) ×3 IMPLANT
PAD CLEANER CAUTERY TIP 5X5 (MISCELLANEOUS) ×2
PENCIL ELECTRO HAND CTR (MISCELLANEOUS) ×3 IMPLANT
SCISSORS METZENBAUM CVD 33 (INSTRUMENTS) ×3 IMPLANT
SLEEVE ENDOPATH XCEL 5M (ENDOMECHANICALS) ×6 IMPLANT
SOL ANTI-FOG 6CC FOG-OUT (MISCELLANEOUS) ×1 IMPLANT
SOL FOG-OUT ANTI-FOG 6CC (MISCELLANEOUS) ×2
SPONGE LAP 18X18 5 PK (GAUZE/BANDAGES/DRESSINGS) ×3 IMPLANT
STOPCOCK 3 WAY  REPLAC (MISCELLANEOUS) ×3 IMPLANT
SUT ETHIBOND 0 MO6 C/R (SUTURE) ×3 IMPLANT
SUT MNCRL AB 4-0 PS2 18 (SUTURE) ×3 IMPLANT
SUT VIC AB 0 CT2 27 (SUTURE) IMPLANT
SUT VICRYL 0 AB UR-6 (SUTURE) ×6 IMPLANT
SYR 20CC LL (SYRINGE) ×3 IMPLANT
TROCAR XCEL BLUNT TIP 100MML (ENDOMECHANICALS) ×3 IMPLANT
TROCAR XCEL NON-BLD 5MMX100MML (ENDOMECHANICALS) ×3 IMPLANT
TUBING INSUFFLATOR HI FLOW (MISCELLANEOUS) ×3 IMPLANT
WATER STERILE IRR 1000ML POUR (IV SOLUTION) ×3 IMPLANT

## 2016-10-07 NOTE — Anesthesia Procedure Notes (Signed)
Performed by: Vickey Boak       

## 2016-10-07 NOTE — Op Note (Addendum)
Pre-operative Diagnosis: Acute cholecystitis  Post-operative Diagnosis: Gangrenous acute cholecystitis  Procedures:  1. Hand-assisted laparoscopic cholecystectomy with extensive lysis of adhesions taking more than 1 hour of total operative time 2. Umbilical hernia repair 3. Liver biopsy 4. Placement # 19 FR RUQ blake drain   Surgeon: Sterling Bigiego Pabon, MD FACS  Anesthesia: Gen. with endotracheal tube   Findings: Necrotic GB Steatosis of liver Dense adhesions from the omentum to GB, omentum to the liver  Very difficult cholecystectomy  Estimated Blood Loss: 200cc               Specimens: Gallbladder, liver           Complications: none   Procedure Details  The patient was seen again in the Holding Room. The benefits, complications, treatment options, and expected outcomes were discussed with the patient. The risks of bleeding, infection, recurrence of symptoms, failure to resolve symptoms, bile duct damage, bile duct leak, retained common bile duct stone, bowel injury, any of which could require further surgery and/or ERCP, stent, or papillotomy were reviewed with the patient. The likelihood of improving the patient's symptoms with return to their baseline status is good.  The patient and/or family concurred with the proposed plan, giving informed consent.  The patient was taken to Operating Room, identified as Max Dixon and the procedure verified as Laparoscopic Cholecystectomy.  A Time Out was held and the above information confirmed.  Prior to the induction of general anesthesia, antibiotic prophylaxis was administered. VTE prophylaxis was in place. General endotracheal anesthesia was then administered and tolerated well. After the induction, the abdomen was prepped with Chloraprep and draped in the sterile fashion. The patient was positioned in the supine position.  Cut down technique was used to enter the abdominal cavity, hernia defect encounter and sac excised, a Hasson  trochar was placed after two vicryl stitches were anchored to the fascia via the hernia defect. Pneumoperitoneum was then created with CO2 and tolerated well without any adverse changes in the patient's vital signs.  Three 5-mm ports were placed in the right upper quadrant all under direct vision.  The patient was positioned  in reverse Trendelenburg, tilted slightly to the patient's left.  The gallbladder was identified, the fundus grasped and retracted cephalad. Adhesions were lysed bluntly and with a combination of electrocautery. Please note that there was significant inflammatory along the infundibulum of the gallbladder and the cystic junction. I really had difficulty with expreaction osure given the patient's obesity and significant inflammatory response. After a good 30 minutes of laparoscopic lysis of adhesions I was Unable to obtain a good exposure, therefore I decided to convert to hand assisted procedure. Mini midline laparotomy was performed using a 10 blade knife. The upper was placed as well as a laparotomy pad. I was then able to have adequate exposure and pushed down the omentum that was blocking our dissection plane. Using my finger and electrocautery I was able to score the peritoneum of the gallbladder to have an adequate mobilization of the neck. Please note there was severe inflammatory response around the infundibulum of the gallbladder and the cystic duct junction. After meticulous dissection and extensive dissection I was able to have a good window. Still there was significant response. I was also able to identify the cystic artery. We were able also to do the dome down technique of the gallbladder wall and there was very inflamed and the posterior wall of the gallbladder was embedded in the liver but I was able  to dissected using finger dissection. An extra cutter was used to have adequate hemostasis along the liver bed. We also found that he had some ST doses of the liver and I and  perform a wedge biopsy of the liver to rule out any cirrhosis. Confirmed that I actually had the right anatomy with perform a cholangiogram via the neck of the gallbladder because I wanted to avoid any potential common bile duct injuries. After a while we had difficulty threading the catheter but were able to an doing safely fashion and perform a cholangiogram injecting contrast. Was good opacification of the common bile duct and no evidence of any obstruction in any of the biliary tree. The contrast had a good flow in an antegrade fashion all the way to the duodenum and also to the hepatic radicles. There was no evidence of common bile duct injury or any bile leaks. After confirming the adequate anatomy then I double clipped the cystic artery and duct in the standard fashion and divided the structures. The specimen was removed and placed in an Endo Catch bag. There are also multiple stones along with the specimen. We irrigated the abdominal cavity with saline showing no evidence of any active bleeding or any bile leak. A drain was placed in the right upper quadrant along the gallbladder bed in the standard fashion and secured with 3-0 nylon. The wound was irrigated and the laparotomy was closed with a continue 0 PDS suture, pleases note that the umbilical defect was incorporated into our repair. The skin was closed with 4-0 Monocryl in a standard fashion. Dermabond was used to coat the skin. Needle and laparotomy counts were correct and there were no immediate complications. Please also note that I infiltrated liposomal Marcaine along with quarter percent Marcaine on all the incision sites for postoperative analgesia.        Sterling Bigiego Pabon, MD, FACS

## 2016-10-07 NOTE — Plan of Care (Signed)
Problem: Safety: Goal: Ability to remain free from injury will improve Outcome: Progressing Educated patient to call for assistance when going to the bathroom to prevent an injury or fall

## 2016-10-07 NOTE — Transfer of Care (Signed)
Immediate Anesthesia Transfer of Care Note  Patient: Max Dixon  Procedure(s) Performed: Procedure(s): LAPAROSCOPIC CHOLECYSTECTOMY WITH INTRAOPERATIVE CHOLANGIOGRAM repair of umbilical hernia (N/A)  Patient Location: PACU  Anesthesia Type:General  Level of Consciousness: sedated  Airway & Oxygen Therapy: Patient Spontanous Breathing and Patient connected to face mask oxygen  Post-op Assessment: Report given to RN and Post -op Vital signs reviewed and stable  Post vital signs: Reviewed and stable  Last Vitals:  Vitals:   10/07/16 1514 10/07/16 1516  BP:    Pulse:    Resp: 18   Temp: 36.7 C 36.7 C    Complications: No apparent anesthesia complications

## 2016-10-07 NOTE — Anesthesia Preprocedure Evaluation (Addendum)
Anesthesia Evaluation  Patient identified by MRN, date of birth, ID band Patient awake    Reviewed: Allergy & Precautions, H&P , NPO status , Patient's Chart, lab work & pertinent test results, reviewed documented beta blocker date and time   Airway Mallampati: II  TM Distance: >3 FB Neck ROM: full    Dental  (+) Edentulous Lower, Edentulous Upper   Pulmonary neg pulmonary ROS, Current Smoker,    Pulmonary exam normal  (-) wheezing      Cardiovascular hypertension, +CHF  negative cardio ROS Normal cardiovascular exam Rhythm:regular Rate:Normal     Neuro/Psych negative neurological ROS  negative psych ROS   GI/Hepatic negative GI ROS, Neg liver ROS,   Endo/Other  negative endocrine ROS  Renal/GU negative Renal ROS  negative genitourinary   Musculoskeletal   Abdominal   Peds  Hematology negative hematology ROS (+)   Anesthesia Other Findings Past Medical History: No date: A-fib (HCC) No date: CHF (congestive heart failure) (HCC) No date: Hyperlipidemia No date: Hypertension Past Surgical History: 09/11/2016: ELECTROPHYSIOLOGIC STUDY N/A     Comment: Procedure: SVT Ablation;  Surgeon: Will Jorja LoaMartin              Camnitz, MD;  Location: [facility] INVASIVE CV LAB;                Service: Cardiovascular;  Laterality: N/A; BMI    Body Mass Index:  26.03 kg/m     Reproductive/Obstetrics negative OB ROS                            Anesthesia Physical Anesthesia Plan  ASA: II and emergent  Anesthesia Plan: General ETT   Post-op Pain Management:    Induction:   Airway Management Planned:   Additional Equipment:   Intra-op Plan:   Post-operative Plan:   Informed Consent: I have reviewed the patients History and Physical, chart, labs and discussed the procedure including the risks, benefits and alternatives for the proposed anesthesia with the patient or authorized representative who has  indicated his/her understanding and acceptance.   Dental Advisory Given  Plan Discussed with: CRNA  Anesthesia Plan Comments:         Anesthesia Quick Evaluation

## 2016-10-07 NOTE — H&P (Signed)
Patient ID: Max AloeJames R Bayle, male   DOB: 1966-09-22, 50 y.o.   MRN: [id number]  CC: Abdominal Pain  HPI Max Dixon is a 50 y.o. male presents to the ER today for evaluation of abdominal pain. Patient reports abdominal pain started 3-4 days ago. It has remained the same since it started but he finally states "I just couldn't take it anymore" and came to the emergency department. It has always been in his midepigastrium to his right upper quadrant. He has had subjective chills and nausea at home but denies any vomiting. He denies any chest pain, shortness of breath, diarrhea, constipation. He's never had pain like this before. He states that he is feeling better since receiving some pain medications from the ER but he remains tender in the right upper quadrant.  HPI  Past Medical History:  Diagnosis Date  . A-fib (HCC)   . CHF (congestive heart failure) (HCC)   . Hyperlipidemia   . Hypertension     Past Surgical History:  Procedure Laterality Date  . ELECTROPHYSIOLOGIC STUDY N/A 09/11/2016   Procedure: SVT Ablation;  Surgeon: Will Jorja LoaMartin Camnitz, MD;  Location: [facility] INVAS[facility];  Service: Cardiovascular;  Laterality: N/A;    History reviewed. Patient denies any known family history of cancer, heart disease, diabetes.   Social History Social History  Substance Use Topics  . Smoking status: Current Every Day Smoker    Packs/day: 2.00    Types: Cigarettes  . Smokeless tobacco: Never Used  . Alcohol use No    No Known Allergies  No current facility-administered medications for this encounter.    Current Outpatient Prescriptions  Medication Sig Dispense Refill  . budesonide-formoterol (SYMBICORT) 80-4.5 MCG/ACT inhaler Inhale 2 puffs into the lungs 2 (two) times daily as needed (shortness of breath).    . fenofibrate (TRICOR) 145 MG tablet Take 145 mg by mouth daily.  0  . ibuprofen (ADVIL,MOTRIN) 200 MG tablet Take 800 mg by mouth every 6 (six) hours as needed for  headache or moderate pain.    Marland Kitchen. levothyroxine (SYNTHROID, LEVOTHROID) 175 MCG tablet Take 175 mcg by mouth every morning.  0  . lisinopril-hydrochlorothiazide (PRINZIDE,ZESTORETIC) 20-12.5 MG tablet Take 2 tablets by mouth daily.  0  . magnesium oxide (MAG-OX) 400 MG tablet Take 400 mg by mouth daily.    . rosuvastatin (CRESTOR) 40 MG tablet Take 1 tablet (40 mg total) by mouth daily. 90 tablet 3     Review of Systems A Multi-point review of systems was asked and was negative except for the findings documented in the history of present illness  Physical Exam Blood pressure (!) 145/89, pulse (!) 105, temperature 98.3 F (36.8 C), temperature source Oral, resp. rate 18, height 5\' 11"  (1.803 m), weight 87.5 kg (193 lb), SpO2 (!) 84 %. CONSTITUTIONAL: Resting in bed in no acute distress. EYES: Pupils are equal, round, and reactive to light, Sclera are non-icteric. EARS, NOSE, MOUTH AND THROAT: The oropharynx is clear. The oral mucosa is pink and moist. Hearing is intact to voice. LYMPH NODES:  Lymph nodes in the neck are normal. RESPIRATORY:  Lungs are clear. There is normal respiratory effort, with equal breath sounds bilaterally, and without pathologic use of accessory muscles. CARDIOVASCULAR: Heart is tachycardic without murmurs, gallops, or rubs. GI: The abdomen is soft, tender to palpation in the right upper quadrant with a positive Murphy sign, and nondistended. There are no palpable masses, however there is a visible umbilical hernia. There is no hepatosplenomegaly.  There are normal bowel sounds in all quadrants. GU: Rectal deferred.   MUSCULOSKELETAL: Normal muscle strength and tone. No cyanosis or edema.   SKIN: Turgor is good and there are no pathologic skin lesions or ulcers. NEUROLOGIC: Motor and sensation is grossly normal. Cranial nerves are grossly intact. PSYCH:  Oriented to person, place and time. Affect is normal.  Data Reviewed Images and labs reviewed. Labs concerning for  leukocytosis of 18.9. LFTs are within normal limits with a bilirubin of 1.1, alkaline phosphatase 58, AST 24, ALT of 30. Electrolytes mildly abnormal with a sodium of 134, chloride 95. CT scan and ultrasound both show mild thickening of gallbladder wall with some pericholecystic fluid. There are numerous gallstones seen in the neck of the gallbladder. I have personally reviewed the patient's imaging, laboratory findings and medical records.    Assessment    Acute cholecystitis    Plan    50 year old male with acute cholecystitis. I discussed the diagnosis and treatment options in detail.  The We discussed the risks and benefits of a laparoscopic cholecystectomy and possible cholangiogram including, but not limited to bleeding, infection, injury to surrounding structures such as the intestine or liver, bile leak, retained gallstones, need to convert to an open procedure, prolonged diarrhea, blood clots such as  DVT, common bile duct injury, anesthesia risks, and possible need for additional procedures.  The likelihood of improvement in symptoms and return to the patient's normal status is good. We discussed the typical post-operative recovery course. Patient voiced understanding. Plan for admission to the hospital for IV hydration, IV antibiotics, as the medications. He'll be posted to the operating room in the morning. Patient has wife are in agreement with this plan.      Time spent with the patient was 50 minutes, with more than 50% of the time spent in face-to-face education, counseling and care coordination.     Ricarda Frameharles Shalie Schremp, MD FACS General Surgeon 10/07/2016, 12:04 AM

## 2016-10-07 NOTE — Anesthesia Procedure Notes (Signed)
Procedure Name: Intubation Performed by: Stormy FabianURTIS, Sujey Gundry Pre-anesthesia Checklist: Patient identified, Patient being monitored, Timeout performed, Emergency Drugs available and Suction available Patient Re-evaluated:Patient Re-evaluated prior to inductionOxygen Delivery Method: Circle system utilized Preoxygenation: Pre-oxygenation with 100% oxygen Intubation Type: IV induction Ventilation: Mask ventilation without difficulty and Oral airway inserted - appropriate to patient size Laryngoscope Size: Mac and 3 Grade View: Grade II Tube type: Oral Tube size: 7.5 mm Number of attempts: 1 Placement Confirmation: ETT inserted through vocal cords under direct vision,  positive ETCO2 and breath sounds checked- equal and bilateral Secured at: 25 cm Tube secured with: Tape Dental Injury: Teeth and Oropharynx as per pre-operative assessment

## 2016-10-07 NOTE — ED Notes (Signed)
Pt transported to room 215A

## 2016-10-07 NOTE — Progress Notes (Signed)
Acute cholecystitis in need for lap chole w IOC. Pt seen and examined. Labs and images reviewed. Recent ablation 4 weeks ago, no signs of CV issues. The risks, benefits, complications, treatment options, and expected outcomes were discussed with the patient. The possibilities of bleeding, recurrent infection, finding a normal gallbladder, perforation of viscus organs, damage to surrounding structures, bile leak, abscess formation, needing a drain placed, the need for additional procedures, reaction to medication, pulmonary aspiration,  failure to diagnose a condition, the possible need to convert to an open procedure, and creating a complication requiring transfusion or operation were discussed with the patient. The patient and/or family concurred with the proposed plan, giving informed consent.

## 2016-10-08 MED ORDER — AMOXICILLIN-POT CLAVULANATE 875-125 MG PO TABS: 1.0000 | ORAL_TABLET | Freq: Two times a day (BID) | ORAL | 0 refills | Status: DC

## 2016-10-08 MED ORDER — OXYCODONE-ACETAMINOPHEN 7.5-325 MG PO TABS: 2.0000 | ORAL_TABLET | ORAL | 0 refills | Status: DC | PRN

## 2016-10-08 NOTE — Final Progress Note (Signed)
1 Day Post-Op   Subjective:  Patient reports tolerating his diet and having good pain control. Strong desire to go home.  Vital signs in last 24 hours: Temp:  [98 F (36.7 C)-99.6 F (37.6 C)] 98 F (36.7 C) (12/10 0540) Pulse Rate:  [83-102] 98 (12/10 0540) Resp:  [17-20] 20 (12/10 0540) BP: (87-140)/(58-112) 134/81 (12/10 0540) SpO2:  [89 %-98 %] 93 % (12/10 0540)    Intake/Output from previous day: 12/09 0701 - 12/10 0700 In: 3608 [P.O.:120; I.V.:3359; IV Piggyback:94] Out: 2550 [Urine:2350; Blood:200]  GI: Abdomen soft, nondistended, purple tender to palpation at his incision sites. JP drain in place draining a serous and was fluid.  Lab Results:  CBC  Recent Labs  10/06/16 1918 10/07/16 0455  WBC 18.9* 15.2*  HGB 16.2 15.1  HCT 47.0 43.6  PLT 251 211   CMP     Component Value Date/Time   NA 136 10/07/2016 0455   NA 140 08/28/2016 0800   K 4.0 10/07/2016 0455   CL 98 (L) 10/07/2016 0455   CO2 31 10/07/2016 0455   GLUCOSE 117 (H) 10/07/2016 0455   BUN 6 10/07/2016 0455   BUN 7 08/28/2016 0800   CREATININE 1.22 10/07/2016 0455   CALCIUM 8.8 (L) 10/07/2016 0455   PROT 7.6 10/07/2016 0455   PROT 7.5 08/28/2016 0800   ALBUMIN 3.9 10/07/2016 0455   ALBUMIN 4.4 08/28/2016 0800   AST 60 (H) 10/07/2016 0455   ALT 55 10/07/2016 0455   ALKPHOS 70 10/07/2016 0455   BILITOT 0.8 10/07/2016 0455   BILITOT 0.6 08/28/2016 0800   GFRNONAA >60 10/07/2016 0455   GFRAA >60 10/07/2016 0455   PT/INR No results for input(s): LABPROT, INR in the last 72 hours.  Studies/Results: Dg Chest 2 View  Result Date: 10/06/2016 CLINICAL DATA:  Constipation with chest pain and low back pain. EXAM: CHEST  2 VIEW COMPARISON:  06/02/2016 FINDINGS: Lungs are adequately inflated and otherwise clear. Mild flattening of the hemidiaphragms. Cardiomediastinal silhouette and remainder the exam is unchanged. IMPRESSION: No active cardiopulmonary disease. Electronically Signed   By: Elberta Fortisaniel   Boyle M.D.   On: 10/06/2016 19:37   Dg Cholangiogram Operative  Result Date: 10/07/2016 CLINICAL DATA:  Intraoperative cholangiogram during laparoscopic cholecystectomy. EXAM: INTRAOPERATIVE CHOLANGIOGRAM FLUOROSCOPY TIME:  45 seconds COMPARISON:  Right upper quadrant abdominal ultrasound - 10/06/2016; CT abdomen pelvis - 10/06/2016 FINDINGS: Four spot intraoperative cholangiographic images of the right upper abdominal quadrant during laparoscopic cholecystectomy are provided for review. Surgical clips overlie the expected location of the gallbladder fossa. Contrast injection demonstrates selective cannulation of the central aspect of the cystic duct. There is passage of contrast through the central aspect of the cystic duct with filling of a non dilated common bile duct. There is passage of contrast though the CBD and into the descending portion of the duodenum. There is minimal reflux of injected contrast into the common hepatic duct and central aspect of the non dilated intrahepatic biliary system. There are no discrete filling defects within the opacified portions of the biliary system to suggest the presence of choledocholithiasis. IMPRESSION: No evidence of choledocholithiasis. Electronically Signed   By: Simonne ComeJohn  Watts M.D.   On: 10/07/2016 14:38   Ct Abdomen Pelvis W Contrast  Result Date: 10/06/2016 CLINICAL DATA:  Generalized back and abdominal pain for 4 days, worse on the right. EXAM: CT ABDOMEN AND PELVIS WITH CONTRAST TECHNIQUE: Multidetector CT imaging of the abdomen and pelvis was performed using the standard protocol following bolus administration  of intravenous contrast. CONTRAST:  100mL ISOVUE-300 IOPAMIDOL (ISOVUE-300) INJECTION 61% COMPARISON:  None. FINDINGS: Lower chest: No acute abnormality. Hepatobiliary: There are multiple calculi within the gallbladder lumen, measuring up to 11 mm. There is mild pericholecystic stranding opacity which may represent cholecystitis. No bile duct  dilatation. Liver is normal. Pancreas: Unremarkable. No pancreatic ductal dilatation or surrounding inflammatory changes. Spleen: Normal in size without focal abnormality. Adrenals/Urinary Tract: Adrenal glands are unremarkable. Kidneys are normal, without renal calculi, focal lesion, or hydronephrosis. Bladder is unremarkable. Stomach/Bowel: Mild fecalization of small bowel content without obstruction. This could represent stasis. Colon is unremarkable, essentially empty. Appendix is normal. Vascular/Lymphatic: The abdominal aorta is normal in caliber with moderate atherosclerotic calcification. There is no adenopathy in the abdomen or pelvis. Reproductive: Unremarkable Other: No ascites. Musculoskeletal: No significant skeletal lesion. IMPRESSION: 1. Cholelithiasis. Mild stranding around the gallbladder, raising the question of cholecystitis. Right upper quadrant ultrasound recommended. 2. Normal appendix. Mild fecalization of small bowel content without obstruction. Electronically Signed   By: Ellery Plunkaniel R Mitchell M.D.   On: 10/06/2016 22:08   [place] Abdomen Limited Ruq  Result Date: 10/06/2016 CLINICAL DATA:  Right upper quadrant pain for 2 days. EXAM: US ABDOMEN LIMITED - RIGHT UPPER QUADRANT COMPARISON:  CT abdomen/ pelvis earlier this day demonstrating gallbladder inflammation and gallstones. FINDINGS: Gallbladder: Distended containing sludge and multiple intraluminal gallstones. There multiple stones in the gallbladder neck, largest measuring 12 mm. There is gallbladder wall thickening up to 3.7 mm with pericholecystic fluid. A positive sonographic Eulah PontMurphy sign was noted by sonographer. Common bile duct: Diameter: 7 mm proximally, distally not well seen. Liver: No focal lesion identified. Within normal limits in parenchymal echogenicity. Normal directional flow in the main portal vein. The IMPRESSION: 1. Acute cholecystitis. 2. Upper normal common bile duct measuring 7 mm. Electronically Signed   By: Rubye OaksMelanie   Ehinger M.D.   On: 10/06/2016 23:25    Assessment/Plan: 50 year old male 1 day status post laparoscopic converted to hand-assisted cholecystectomy. Doing very well. Discussed signs and symptoms of infection and return to do clinically emergency room immediately should they occur. Otherwise discussed drain care and wound care. All questions answered to their satisfaction and patient desires to go home.   Ricarda Frameharles Lacretia Tindall, MD FACS General Surgeon  10/08/2016

## 2016-10-08 NOTE — Discharge Summary (Signed)
Patient ID: Max Dixon MRN: [id number] DOB/AGE: Jul 03, 1966 50 y.o.  Admit date: 10/06/2016 Discharge date: 10/08/2016  Discharge Diagnoses:  Acute cholecystitis  Procedures Performed: Hand-assisted laparoscopic cholecystectomy  Discharged Condition: good  Hospital Course: Patient admitted from the emergency department with acute cholecystitis. Taken to the operating room for a laparoscopic converted to hand-assisted cholecystectomy. Tolerated the surgery well. On the day of discharge he was afebrile, tolerating a diet, having pain controlled on oral medications.  Discharge Orders:  discharge home.  Disposition: 01-Home or Self Care  Discharge Medications:   Medication List    TAKE these medications   amoxicillin-clavulanate 875-125 MG tablet Commonly known as:  AUGMENTIN Take 1 tablet by mouth 2 (two) times daily.   budesonide-formoterol 80-4.5 MCG/ACT inhaler Commonly known as:  SYMBICORT Inhale 2 puffs into the lungs 2 (two) times daily as needed (shortness of breath).   fenofibrate 145 MG tablet Commonly known as:  TRICOR Take 145 mg by mouth daily.   ibuprofen 200 MG tablet Commonly known as:  ADVIL,MOTRIN Take 800 mg by mouth every 6 (six) hours as needed for headache or moderate pain.   levothyroxine 175 MCG tablet Commonly known as:  SYNTHROID, LEVOTHROID Take 175 mcg by mouth every morning.   lisinopril-hydrochlorothiazide 20-12.5 MG tablet Commonly known as:  PRINZIDE,ZESTORETIC Take 2 tablets by mouth daily.   magnesium oxide 400 MG tablet Commonly known as:  MAG-OX Take 400 mg by mouth daily.   oxyCODONE-acetaminophen 7.5-325 MG tablet Commonly known as:  PERCOCET Take 2 tablets by mouth every 4 (four) hours as needed for moderate pain (may take 1 for mild pain).   rosuvastatin 40 MG tablet Commonly known as:  CRESTOR Take 1 tablet (40 mg total) by mouth daily.        Follwup: Follow-up Information    Leafy Roiego F Pabon, MD. Go in 3  day(s).   Specialty:  General Surgery Why:  Patient to [facility] Ever[facility]Pabon [facility] 3[facility]45[facility] on Wednesday for drain removal. Please arrive 15 minutes early. Contact information: [address] [place] [id number] [phone]           Signed: Ricarda FrameCharles Kenadi Miltner 10/08/2016, 9:42 AM

## 2016-10-08 NOTE — Discharge Instructions (Signed)
Laparoscopic Cholecystectomy, Care After °This sheet gives you information about how to care for yourself after your procedure. Your health care provider may also give you more specific instructions. If you have problems or questions, contact your health care provider. °What can I expect after the procedure? °After the procedure, it is common to have: °· Pain at your incision sites. You will be given medicines to control this pain. °· Mild nausea or vomiting. °· Bloating and possible shoulder pain from the air-like gas that was used during the procedure. ° °Follow these instructions at home: °Incision care ° °· Follow instructions from your health care provider about how to take care of your incisions. Make sure you: °? Wash your hands with soap and water before you change your bandage (dressing). If soap and water are not available, use hand sanitizer. °? Change your dressing as told by your health care provider. °? Leave stitches (sutures), skin glue, or adhesive strips in place. These skin closures may need to be in place for 2 weeks or longer. If adhesive strip edges start to loosen and curl up, you may trim the loose edges. Do not remove adhesive strips completely unless your health care provider tells you to do that. °· Do not take baths, swim, or use a hot tub until your health care provider approves. Ask your health care provider if you can take showers. You may only be allowed to take sponge baths for bathing. °· Check your incision area every day for signs of infection. Check for: °? More redness, swelling, or pain. °? More fluid or blood. °? Warmth. °? Pus or a bad smell. °Activity °· Do not drive or use heavy machinery while taking prescription pain medicine. °· Do not lift anything that is heavier than 10 lb (4.5 kg) until your health care provider approves. °· Do not play contact sports until your health care provider approves. °· Do not drive for 24 hours if you were given a medicine to help you relax  (sedative). °· Rest as needed. Do not return to work or school until your health care provider approves. °General instructions °· Take over-the-counter and prescription medicines only as told by your health care provider. °· To prevent or treat constipation while you are taking prescription pain medicine, your health care provider may recommend that you: °? Drink enough fluid to keep your urine clear or pale yellow. °? Take over-the-counter or prescription medicines. °? Eat foods that are high in fiber, such as fresh fruits and vegetables, whole grains, and beans. °? Limit foods that are high in fat and processed sugars, such as fried and sweet foods. °Contact a health care provider if: °· You develop a rash. °· You have more redness, swelling, or pain around your incisions. °· You have more fluid or blood coming from your incisions. °· Your incisions feel warm to the touch. °· You have pus or a bad smell coming from your incisions. °· You have a fever. °· One or more of your incisions breaks open. °Get help right away if: °· You have trouble breathing. °· You have chest pain. °· You have increasing pain in your shoulders. °· You faint or feel dizzy when you stand. °· You have severe pain in your abdomen. °· You have nausea or vomiting that lasts for more than one day. °· You have leg pain. °This information is not intended to replace advice given to you by your health care provider. Make sure you discuss any questions you   your health care provider. Document Released: 10/16/2005 Document Revised: 05/06/2016 Document Reviewed: 04/03/2016 Elsevier Interactive Patient Education  2017 [employer]  [facility] Introduction Surgical drains are used to remove extra fluid that normally builds up in a surgical wound after surgery. A surgical drain helps to heal a surgical wound. Different kinds of surgical drains include:  Active drains. These drains use suction to pull drainage away from  the surgical wound. Drainage flows through a tube to a container outside of the body. It is important to keep the bulb or the drainage container flat (compressed) at all times, except while you empty it. Flattening the bulb or container creates suction. The two most common types of active drains are bulb drains and Hemovac drains.  Passive drains. These drains allow fluid to drain naturally, by gravity. Drainage flows through a tube to a bandage (dressing) or a container outside of the body. Passive drains do not need to be emptied. The most common type of passive drain is the Penrose drain. A drain is placed during surgery. Immediately after surgery, drainage is usually bright red and a little thicker than water. The drainage may gradually turn yellow or pink and become thinner. It is likely that your health care provider will remove the drain when the drainage stops or when the amount decreases to 1-2 Tbsp (15-30 mL) during a 24-hour period. How to care for your surgical drain  Keep the skin around the drain dry and covered with a dressing at all times.  Check your drain area every day for signs of infection. Check for:  More redness, swelling, or pain.  Pus or a bad smell.  Cloudy drainage. Follow instructions from your health care provider about how to take care of your drain and how to change your dressing. Change your dressing at least one time every day. Change it more often if needed to keep the dressing dry. Make sure you: 1. Gather your supplies, including:  Tape.  Germ-free cleaning solution (sterile saline).  Split gauze drain sponge: 4 x 4 inches (10 x 10 cm).  Gauze square: 4 x 4 inches (10 x 10 cm). 2. Wash your hands with soap and water before you change your dressing. If soap and water are not available, use hand sanitizer. 3. Remove the old dressing. Avoid using scissors to do that. 4. Use sterile saline to clean your skin around the drain. 5. Place the tube through the  slit in a drain sponge. Place the drain sponge so that it covers your wound. 6. Place the gauze square or another drain sponge on top of the drain sponge that is on the wound. Make sure the tube is between those layers. 7. Tape the dressing to your skin. 8. If you have an active bulb or Hemovac drain, tape the drainage tube to your skin 1-2 inches (2.5-5 cm) below the place where the tube enters your body. Taping keeps the tube from pulling on any stitches (sutures) that you have. 9. Wash your hands with soap and water. 10. Write down the color of your drainage and how often you change your dressing. How to empty your active bulb or Hemovac drain 1. Make sure that you have a measuring cup that you can empty your drainage into. 2. Wash your hands with soap and water. If soap and water are not available, use hand sanitizer. 3. Gently move your fingers down the tube while squeezing very lightly. This is called stripping the tube. This  clears any drainage, clots, or tissue from the tube.  Do not pull on the tube.  You may need to strip the tube several times every day to keep the tube clear. 4. Open the bulb cap or the drain plug. Do not touch the inside of the cap or the bottom of the plug. 5. Empty all of the drainage into the measuring cup. 6. Compress the bulb or the container and replace the cap or the plug. To compress the bulb or the container, squeeze it firmly in the middle while you close the cap or plug the container. 7. Write down the amount of drainage that you have in each 24-hour period. If you have less than 2 Tbsp (30 mL) of drainage during 24 hours, contact your health care provider. 8. Flush the drainage down the toilet. 9. Wash your hands with soap and water. Contact a health care provider if:  You have more redness, swelling, or pain around your drain area.  The amount of drainage that you have is increasing instead of decreasing.  You have pus or a bad smell coming from  your drain area.  You have a fever.  You have drainage that is cloudy.  There is a sudden stop or a sudden decrease in the amount of drainage that you have.  Your tube falls out.  Your active draindoes not stay compressedafter you empty it. This information is not intended to replace advice given to you by your health care provider. Make sure you discuss any questions you have with your health care provider. Document Released: 10/13/2000 Document Revised: 03/23/2016 Document Reviewed: 05/05/2015  2017 Elsevier

## 2016-10-08 NOTE — Progress Notes (Signed)
Patient is discharged home with wife in stable condition, discharge instructions reviewed with patient including care of JP drain, IV access removed and pressure dressing applied.

## 2016-10-09 ENCOUNTER — Encounter: Payer: Self-pay | Admitting: Surgery

## 2016-10-10 NOTE — Anesthesia Postprocedure Evaluation (Signed)
Anesthesia Post Note  Patient: Max ArgyleJames R Ligman  Procedure(s) Performed: Procedure(s) (LRB): LAPAROSCOPIC CHOLECYSTECTOMY WITH INTRAOPERATIVE CHOLANGIOGRAM repair of umbilical hernia (N/A)  Patient location during evaluation: PACU Anesthesia Type: General Level of consciousness: awake and alert Pain management: pain level controlled Vital Signs Assessment: post-procedure vital signs reviewed and stable Respiratory status: spontaneous breathing, nonlabored ventilation, respiratory function stable and patient connected to nasal cannula oxygen Cardiovascular status: blood pressure returned to baseline and stable Postop Assessment: no signs of nausea or vomiting Anesthetic complications: no    Last Vitals:  Vitals:   10/08/16 0540 10/08/16 0540  BP: 134/81   Pulse: 98 98  Resp: 20   Temp: 36.7 C     Last Pain:  Vitals:   10/08/16 0626  TempSrc:   PainSc: 0-No pain                 [name]

## 2016-10-11 ENCOUNTER — Encounter: Payer: Self-pay | Admitting: Surgery

## 2016-10-11 ENCOUNTER — Ambulatory Visit (INDEPENDENT_AMBULATORY_CARE_PROVIDER_SITE_OTHER): Payer: Managed Care, Other (non HMO) | Admitting: Surgery

## 2016-10-11 VITALS — BP 176/101 | HR 87 | Temp 98.6°F | Ht 71.0 in | Wt 190.6 lb

## 2016-10-11 DIAGNOSIS — Z09 Encounter for follow-up examination after completed treatment for conditions other than malignant neoplasm: Secondary | ICD-10-CM

## 2016-10-11 LAB — SURGICAL PATHOLOGY

## 2016-10-11 NOTE — Patient Instructions (Signed)

## 2016-10-11 NOTE — Progress Notes (Signed)
S/p HALS GB for gangrenous chole Path reviewed and d/w pt He is doing very well, taking PO, ambulating and minimal serous output from drain  PE NAD Abd: soft, incision c/d/i, jp removed ( serous). No peritonitis or infection  A/P doing well No lifting for 6 weeks Encourage smoking cessation RTC prn

## 2016-11-27 ENCOUNTER — Ambulatory Visit: Payer: Self-pay | Admitting: Physician Assistant

## 2016-11-27 DIAGNOSIS — Z299 Encounter for prophylactic measures, unspecified: Secondary | ICD-10-CM

## 2016-11-27 NOTE — Progress Notes (Signed)
Pt here for labs ordered by Tejan-Sie

## 2016-11-28 LAB — THYROID PANEL WITH TSH
Free Thyroxine Index: 4.2 (ref 1.2–4.9)
T3 Uptake Ratio: 32 % (ref 24–39)
T4, Total: 13 ug/dL — ABNORMAL HIGH (ref 4.5–12.0)
TSH: 2.6 u[IU]/mL (ref 0.450–4.500)

## 2016-11-28 LAB — TESTOSTERONE,FREE AND TOTAL
TESTOSTERONE FREE: 8.3 pg/mL (ref 7.2–24.0)
TESTOSTERONE: 260 ng/dL — AB (ref 264–916)

## 2017-03-15 ENCOUNTER — Telehealth: Payer: Self-pay | Admitting: Cardiology

## 2017-03-15 DIAGNOSIS — R002 Palpitations: Secondary | ICD-10-CM
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
## 2017-03-15 NOTE — Telephone Encounter (Signed)
New message     Per wife pt is cold and clammy, headache , blurred vision , hot flashes like he is burning up , sweating , he has had several of this episodes yesterday , really weak

## 2017-03-16 MED ORDER — METOPROLOL TARTRATE 100 MG PO TABS: 100.0000 mg | ORAL_TABLET | Freq: Two times a day (BID) | ORAL | 0 refills | Status: DC

## 2017-03-16 NOTE — Telephone Encounter (Signed)
Advised office will contact her next week to arrange event monitor. Advised to restart Lopressor. Informed that I would call, once event monitor scheduled, and arrange f/u OV w/ Camnitz. Bonita QuinLinda, wife, verbalized understanding and agreeable to plan.

## 2017-03-21 ENCOUNTER — Ambulatory Visit (INDEPENDENT_AMBULATORY_CARE_PROVIDER_SITE_OTHER): Payer: Managed Care, Other (non HMO)

## 2017-03-21 DIAGNOSIS — R002 Palpitations: Secondary | ICD-10-CM | POA: Diagnosis not present

## 2017-05-01 ENCOUNTER — Ambulatory Visit: Payer: Self-pay | Admitting: Cardiology

## 2017-05-09 ENCOUNTER — Other Ambulatory Visit: Payer: Self-pay

## 2017-05-18 ENCOUNTER — Other Ambulatory Visit: Payer: Self-pay

## 2017-05-18 DIAGNOSIS — Z299 Encounter for prophylactic measures, unspecified: Secondary | ICD-10-CM

## 2017-05-18 NOTE — Progress Notes (Signed)
Patient came in to have blood drawn for testing per Dr. Tejan-Sie's orders. 

## 2017-05-19 LAB — SPECIMEN STATUS

## 2017-05-21 LAB — COMPREHENSIVE METABOLIC PANEL
A/G RATIO: 1.3 (ref 1.2–2.2)
ALBUMIN: 4.5 g/dL (ref 3.5–5.5)
ALT: 27 IU/L (ref 0–44)
AST: 27 IU/L (ref 0–40)
Alkaline Phosphatase: 87 IU/L (ref 39–117)
BUN/Creatinine Ratio: 5 — ABNORMAL LOW (ref 9–20)
BUN: 5 mg/dL — ABNORMAL LOW (ref 6–24)
Bilirubin Total: 0.4 mg/dL (ref 0.0–1.2)
CO2: 23 mmol/L (ref 20–29)
CREATININE: 1.07 mg/dL (ref 0.76–1.27)
Calcium: 9.4 mg/dL (ref 8.7–10.2)
Chloride: 93 mmol/L — ABNORMAL LOW (ref 96–106)
GFR, EST AFRICAN AMERICAN: 91 mL/min/{1.73_m2} (ref >59)
GFR, EST NON AFRICAN AMERICAN: 79 mL/min/{1.73_m2} (ref >59)
GLOBULIN, TOTAL: 3.4 g/dL (ref 1.5–4.5)
Glucose: 75 mg/dL (ref 65–99)
POTASSIUM: 3.9 mmol/L (ref 3.5–5.2)
SODIUM: 138 mmol/L (ref 134–144)
TOTAL PROTEIN: 7.9 g/dL (ref 6.0–8.5)

## 2017-05-21 LAB — LIPID PANEL
Chol/HDL Ratio: 9.5 ratio — ABNORMAL HIGH (ref 0.0–5.0)
Cholesterol, Total: 227 mg/dL — ABNORMAL HIGH (ref 100–199)
HDL: 24 mg/dL — ABNORMAL LOW (ref >39)
Triglycerides: 604 mg/dL (ref 0–149)

## 2017-05-21 LAB — T4: T4 TOTAL: 9.1 ug/dL (ref 4.5–12.0)

## 2017-05-21 LAB — TSH: TSH: 31.8 u[IU]/mL — AB (ref 0.450–4.500)

## 2017-09-07 ENCOUNTER — Other Ambulatory Visit: Payer: Self-pay

## 2017-09-07 ENCOUNTER — Encounter: Payer: Self-pay | Admitting: Emergency Medicine

## 2017-09-07 ENCOUNTER — Emergency Department: Payer: Managed Care, Other (non HMO)

## 2017-09-07 DIAGNOSIS — Z5321 Procedure and treatment not carried out due to patient leaving prior to being seen by health care provider: Secondary | ICD-10-CM | POA: Diagnosis not present

## 2017-09-07 DIAGNOSIS — M25532 Pain in left wrist: Secondary | ICD-10-CM | POA: Diagnosis not present

## 2017-09-07 DIAGNOSIS — M79605 Pain in left leg: Secondary | ICD-10-CM | POA: Insufficient documentation
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
## 2017-09-08 ENCOUNTER — Emergency Department
Admission: EM | Admit: 2017-09-08 | Discharge: 2017-09-08 | Disposition: A | Discharge status: Home / Self Care | Payer: Managed Care, Other (non HMO) | Attending: Emergency Medicine | Admitting: Emergency Medicine

## 2017-09-08 NOTE — ED Notes (Signed)
As soon as pt was placed in room he demanded to leave AMA. Pt signed and left facility.

## 2017-12-10 ENCOUNTER — Other Ambulatory Visit: Payer: Self-pay

## 2017-12-10 ENCOUNTER — Encounter: Payer: Self-pay | Admitting: *Deleted

## 2017-12-11 NOTE — Discharge Instructions (Signed)

## 2017-12-12 ENCOUNTER — Encounter: Payer: Self-pay | Admitting: *Deleted

## 2017-12-12 ENCOUNTER — Ambulatory Visit: Payer: Managed Care, Other (non HMO) | Admitting: Anesthesiology

## 2017-12-12 ENCOUNTER — Encounter
Admission: RE | Disposition: A | Discharge status: Home / Self Care | Payer: Self-pay | Source: Ambulatory Visit | Attending: Ophthalmology

## 2017-12-12 ENCOUNTER — Ambulatory Visit
Admission: RE | Admit: 2017-12-12 | Discharge: 2017-12-12 | Disposition: A | Discharge status: Home / Self Care | Payer: Managed Care, Other (non HMO) | Source: Ambulatory Visit | Attending: Ophthalmology | Admitting: Ophthalmology

## 2017-12-12 DIAGNOSIS — J449 Chronic obstructive pulmonary disease, unspecified: Secondary | ICD-10-CM | POA: Insufficient documentation

## 2017-12-12 DIAGNOSIS — E7800 Pure hypercholesterolemia, unspecified: Secondary | ICD-10-CM | POA: Insufficient documentation

## 2017-12-12 DIAGNOSIS — F17200 Nicotine dependence, unspecified, uncomplicated: Secondary | ICD-10-CM | POA: Diagnosis not present

## 2017-12-12 DIAGNOSIS — H2512 Age-related nuclear cataract, left eye: Secondary | ICD-10-CM | POA: Diagnosis present

## 2017-12-12 DIAGNOSIS — I4891 Unspecified atrial fibrillation: Secondary | ICD-10-CM | POA: Diagnosis not present

## 2017-12-12 DIAGNOSIS — I10 Essential (primary) hypertension: Secondary | ICD-10-CM | POA: Insufficient documentation

## 2017-12-12 DIAGNOSIS — E039 Hypothyroidism, unspecified: Secondary | ICD-10-CM | POA: Insufficient documentation

## 2017-12-12 DIAGNOSIS — Z79899 Other long term (current) drug therapy: Secondary | ICD-10-CM | POA: Insufficient documentation

## 2017-12-12 HISTORY — PX: CATARACT EXTRACTION W/PHACO: SHX586

## 2017-12-12 HISTORY — DX: Hypothyroidism, unspecified: E03.9

## 2017-12-12 HISTORY — DX: Simple chronic bronchitis: J41.0

## 2017-12-12 SURGERY — PHACOEMULSIFICATION, CATARACT, WITH IOL INSERTION
Anesthesia: Monitor Anesthesia Care | Site: Eye | Laterality: Left | Wound class: Clean

## 2017-12-12 MED ORDER — EPINEPHRINE PF 1 MG/ML IJ SOLN
INTRAOCULAR | Status: DC | PRN
  Administered 2017-12-12: 79 mL via OPHTHALMIC

## 2017-12-12 MED ORDER — MIDAZOLAM HCL 2 MG/2ML IJ SOLN
INTRAMUSCULAR | Status: DC | PRN
  Administered 2017-12-12: 2 mg via INTRAVENOUS

## 2017-12-12 MED ORDER — FENTANYL CITRATE (PF) 100 MCG/2ML IJ SOLN: 25.0000 ug | INTRAMUSCULAR | Status: DC | PRN

## 2017-12-12 MED ORDER — NA HYALUR & NA CHOND-NA HYALUR 0.4-0.35 ML IO KIT
PACK | INTRAOCULAR | Status: DC | PRN
  Administered 2017-12-12: 1 mL via INTRAOCULAR

## 2017-12-12 MED ORDER — FENTANYL CITRATE (PF) 100 MCG/2ML IJ SOLN
INTRAMUSCULAR | Status: DC | PRN
  Administered 2017-12-12: 100 ug via INTRAVENOUS

## 2017-12-12 MED ORDER — PROMETHAZINE HCL 25 MG/ML IJ SOLN: 6.2500 mg | INTRAMUSCULAR | Status: DC | PRN

## 2017-12-12 MED ORDER — LIDOCAINE HCL (PF) 2 % IJ SOLN
INTRAOCULAR | Status: DC | PRN
  Administered 2017-12-12: 1 mL

## 2017-12-12 MED ORDER — CEFUROXIME OPHTHALMIC INJECTION 1 MG/0.1 ML
INJECTION | OPHTHALMIC | Status: DC | PRN
  Administered 2017-12-12: 0.1 mL via INTRACAMERAL

## 2017-12-12 MED ORDER — LACTATED RINGERS IV SOLN: 10.0000 mL/h | INTRAVENOUS | Status: DC

## 2017-12-12 MED ORDER — OXYCODONE HCL 5 MG PO TABS: 5.0000 mg | ORAL_TABLET | Freq: Once | ORAL | Status: DC | PRN

## 2017-12-12 MED ORDER — MEPERIDINE HCL 25 MG/ML IJ SOLN: 6.2500 mg | INTRAMUSCULAR | Status: DC | PRN

## 2017-12-12 MED ORDER — OXYCODONE HCL 5 MG/5ML PO SOLN: 5.0000 mg | Freq: Once | ORAL | Status: DC | PRN

## 2017-12-12 MED ORDER — BRIMONIDINE TARTRATE-TIMOLOL 0.2-0.5 % OP SOLN
OPHTHALMIC | Status: DC | PRN
  Administered 2017-12-12: 1 [drp] via OPHTHALMIC

## 2017-12-12 MED ORDER — MOXIFLOXACIN HCL 0.5 % OP SOLN
1.0000 [drp] | OPHTHALMIC | Status: DC | PRN
  Administered 2017-12-12 (×3): 1 [drp] via OPHTHALMIC

## 2017-12-12 MED ORDER — ARMC OPHTHALMIC DILATING DROPS
1.0000 "application " | OPHTHALMIC | Status: DC | PRN
  Administered 2017-12-12 (×3): 1 via OPHTHALMIC

## 2017-12-12 SURGICAL SUPPLY — 25 items
CANNULA ANT/CHMB 27GA (MISCELLANEOUS) ×3 IMPLANT
CARTRIDGE ABBOTT (MISCELLANEOUS) IMPLANT
GLOVE SURG LX 7.5 STRW (GLOVE) ×2
GLOVE SURG LX STRL 7.5 STRW (GLOVE) ×1 IMPLANT
GLOVE SURG TRIUMPH 8.0 PF LTX (GLOVE) ×3 IMPLANT
GOWN STRL REUS W/ TWL LRG LVL3 (GOWN DISPOSABLE) ×2 IMPLANT
GOWN STRL REUS W/TWL LRG LVL3 (GOWN DISPOSABLE) ×4
LENS IOL TECNIS ITEC 23.5 (Intraocular Lens) ×3 IMPLANT
MARKER SKIN DUAL TIP RULER LAB (MISCELLANEOUS) ×3 IMPLANT
NDL RETROBULBAR .5 NSTRL (NEEDLE) IMPLANT
NEEDLE FILTER BLUNT 18X 1/2SAF (NEEDLE) ×2
NEEDLE FILTER BLUNT 18X1 1/2 (NEEDLE) ×1 IMPLANT
PACK CATARACT BRASINGTON (MISCELLANEOUS) ×3 IMPLANT
PACK EYE AFTER SURG (MISCELLANEOUS) ×3 IMPLANT
PACK OPTHALMIC (MISCELLANEOUS) ×3 IMPLANT
RING MALYGIN 7.0 (MISCELLANEOUS) IMPLANT
SUT ETHILON 10-0 CS-B-6CS-B-6 (SUTURE)
SUT VICRYL  9 0 (SUTURE)
SUT VICRYL 9 0 (SUTURE) IMPLANT
SUTURE EHLN 10-0 CS-B-6CS-B-6 (SUTURE) IMPLANT
SYR 3ML LL SCALE MARK (SYRINGE) ×3 IMPLANT
SYR 5ML LL (SYRINGE) ×3 IMPLANT
SYR TB 1ML LUER SLIP (SYRINGE) ×3 IMPLANT
WATER STERILE IRR 500ML POUR (IV SOLUTION) ×3 IMPLANT
WIPE NON LINTING 3.25X3.25 (MISCELLANEOUS) ×3 IMPLANT

## 2017-12-12 NOTE — Anesthesia Procedure Notes (Signed)
Procedure Name: MAC Performed by: Cedarius Kersh, CRNA Pre-anesthesia Checklist: Patient identified, Emergency Drugs available, Suction available, Timeout performed and Patient being monitored Patient Re-evaluated:Patient Re-evaluated prior to induction Oxygen Delivery Method: Nasal cannula Placement Confirmation: positive ETCO2       

## 2017-12-12 NOTE — Op Note (Signed)
OPERATIVE NOTE  Orson AloeJames R Beaubrun [id number] 12/12/2017   PREOPERATIVE DIAGNOSIS:  Nuclear sclerotic cataract left eye. H25.12   POSTOPERATIVE DIAGNOSIS:    Nuclear sclerotic cataract left eye.     PROCEDURE:  Phacoemusification with posterior chamber intraocular lens placement of the left eye   LENS:   Implant Name Type Inv. Item Serial No. Manufacturer Lot No. LRB No. Used  LENS IOL DIOP 23.5 - [id number] Intraocular Lens LENS IOL DIOP 23.5 [id number]  Left 1        ULTRASOUND TIME: 14  % of 1 minutes 6 seconds, CDE 9.4  SURGEON:  Deirdre Evenerhadwick R. Lanyah Spengler, MD   ANESTHESIA:  Topical with tetracaine drops and 2% Xylocaine jelly, augmented with 1% preservative-free intracameral lidocaine.    COMPLICATIONS:  None.   DESCRIPTION OF PROCEDURE:  The patient was identified in the holding room and transported to the operating room and placed in the supine position under the operating microscope.  The left eye was identified as the operative eye and it was prepped and draped in the usual sterile ophthalmic fashion.   A 1 millimeter clear-corneal paracentesis was made at the 1:30 position.  0.5 ml of preservative-free 1% lidocaine was injected into the anterior chamber.  The anterior chamber was filled with Viscoat viscoelastic.  A 2.4 millimeter keratome was used to make a near-clear corneal incision at the 10:30 position.  .  A curvilinear capsulorrhexis was made with a cystotome and capsulorrhexis forceps.  Balanced salt solution was used to hydrodissect and hydrodelineate the nucleus.   Phacoemulsification was then used in stop and chop fashion to remove the lens nucleus and epinucleus.  The remaining cortex was then removed using the irrigation and aspiration handpiece. Provisc was then placed into the capsular bag to distend it for lens placement.  A lens was then injected into the capsular bag.  The remaining viscoelastic was aspirated.   Wounds were hydrated with balanced salt  solution.  The anterior chamber was inflated to a physiologic pressure with balanced salt solution.  No wound leaks were noted. Cefuroxime 0.1 ml of a 10mg /ml solution was injected into the anterior chamber for a dose of 1 mg of intracameral antibiotic at the completion of the case.   Timolol and Brimonidine drops were applied to the eye.  The patient was taken to the recovery room in stable condition without complications of anesthesia or surgery.  Joycelynn Fritsche 12/12/2017, 10:10 AM

## 2017-12-12 NOTE — Anesthesia Postprocedure Evaluation (Signed)
Anesthesia Post Note  Patient: Max Dixon  Procedure(s) Performed: CATARACT EXTRACTION PHACO AND INTRAOCULAR LENS PLACEMENT (IOC) LEFT (Left Eye)  Patient location during evaluation: PACU Anesthesia Type: MAC Level of consciousness: awake and alert Pain management: pain level controlled Vital Signs Assessment: post-procedure vital signs reviewed and stable Respiratory status: spontaneous breathing, nonlabored ventilation, respiratory function stable and patient connected to nasal cannula oxygen Cardiovascular status: blood pressure returned to baseline and stable Postop Assessment: no apparent nausea or vomiting Anesthetic complications: no    SCOURAS, NICOLE ELAINE

## 2017-12-12 NOTE — H&P (Signed)
The History and Physical notes are on paper, have been signed, and are to be scanned. The patient remains stable and unchanged from the H&P.   Previous H&P reviewed, patient examined, and there are no changes.  Max Dixon 12/12/2017 9:17 AM

## 2017-12-12 NOTE — Transfer of Care (Signed)
Immediate Anesthesia Transfer of Care Note  Patient: Max Dixon  Procedure(s) Performed: CATARACT EXTRACTION PHACO AND INTRAOCULAR LENS PLACEMENT (IOC) LEFT (Left Eye)  Patient Location: PACU  Anesthesia Type: MAC  Level of Consciousness: awake, alert  and patient cooperative  Airway and Oxygen Therapy: Patient Spontanous Breathing and Patient connected to supplemental oxygen  Post-op Assessment: Post-op Vital signs reviewed, Patient's Cardiovascular Status Stable, Respiratory Function Stable, Patent Airway and No signs of Nausea or vomiting  Post-op Vital Signs: Reviewed and stable  Complications: No apparent anesthesia complications

## 2017-12-12 NOTE — Anesthesia Preprocedure Evaluation (Signed)
Anesthesia Evaluation  Patient identified by MRN, date of birth, ID band Patient awake    Reviewed: Allergy & Precautions, H&P , NPO status , Patient's Chart, lab work & pertinent test results, reviewed documented beta blocker date and time   Airway Mallampati: II  TM Distance: >3 FB Neck ROM: full    Dental no notable dental hx.    Pulmonary Current Smoker,    Pulmonary exam normal breath sounds clear to auscultation       Cardiovascular Exercise Tolerance: Good hypertension, negative cardio ROS  + dysrhythmias Atrial Fibrillation  Rhythm:regular Rate:Normal     Neuro/Psych negative neurological ROS  negative psych ROS   GI/Hepatic negative GI ROS, Neg liver ROS,   Endo/Other  Hypothyroidism   Renal/GU negative Renal ROS  negative genitourinary   Musculoskeletal   Abdominal   Peds  Hematology negative hematology ROS (+)   Anesthesia Other Findings   Reproductive/Obstetrics negative OB ROS                             Anesthesia Physical Anesthesia Plan  ASA: II  Anesthesia Plan: MAC   Post-op Pain Management:    Induction:   PONV Risk Score and Plan:   Airway Management Planned:   Additional Equipment:   Intra-op Plan:   Post-operative Plan:   Informed Consent: I have reviewed the patients History and Physical, chart, labs and discussed the procedure including the risks, benefits and alternatives for the proposed anesthesia with the patient or authorized representative who has indicated his/her understanding and acceptance.   Dental Advisory Given  Plan Discussed with: CRNA  Anesthesia Plan Comments:         Anesthesia Quick Evaluation

## 2017-12-27 ENCOUNTER — Encounter: Payer: Self-pay | Admitting: *Deleted

## 2017-12-27 ENCOUNTER — Other Ambulatory Visit: Payer: Self-pay

## 2017-12-27 NOTE — Discharge Instructions (Signed)

## 2018-01-02 ENCOUNTER — Ambulatory Visit: Payer: Managed Care, Other (non HMO) | Admitting: Anesthesiology

## 2018-01-02 ENCOUNTER — Encounter: Payer: Self-pay | Admitting: *Deleted

## 2018-01-02 ENCOUNTER — Encounter
Admission: RE | Disposition: A | Discharge status: Home / Self Care | Payer: Self-pay | Source: Ambulatory Visit | Attending: Ophthalmology

## 2018-01-02 ENCOUNTER — Ambulatory Visit
Admission: RE | Admit: 2018-01-02 | Discharge: 2018-01-02 | Disposition: A | Discharge status: Home / Self Care | Payer: Managed Care, Other (non HMO) | Source: Ambulatory Visit | Attending: Ophthalmology | Admitting: Ophthalmology

## 2018-01-02 DIAGNOSIS — I4891 Unspecified atrial fibrillation: Secondary | ICD-10-CM | POA: Diagnosis not present

## 2018-01-02 DIAGNOSIS — E7800 Pure hypercholesterolemia, unspecified: Secondary | ICD-10-CM | POA: Diagnosis not present

## 2018-01-02 DIAGNOSIS — E039 Hypothyroidism, unspecified: Secondary | ICD-10-CM | POA: Diagnosis not present

## 2018-01-02 DIAGNOSIS — Z79890 Hormone replacement therapy (postmenopausal): Secondary | ICD-10-CM | POA: Insufficient documentation

## 2018-01-02 DIAGNOSIS — H2511 Age-related nuclear cataract, right eye: Secondary | ICD-10-CM | POA: Diagnosis not present

## 2018-01-02 DIAGNOSIS — I10 Essential (primary) hypertension: Secondary | ICD-10-CM | POA: Insufficient documentation

## 2018-01-02 DIAGNOSIS — F17200 Nicotine dependence, unspecified, uncomplicated: Secondary | ICD-10-CM | POA: Diagnosis not present

## 2018-01-02 DIAGNOSIS — Z9842 Cataract extraction status, left eye: Secondary | ICD-10-CM | POA: Insufficient documentation

## 2018-01-02 DIAGNOSIS — Z79899 Other long term (current) drug therapy: Secondary | ICD-10-CM | POA: Diagnosis not present

## 2018-01-02 HISTORY — DX: Supraventricular tachycardia, unspecified: I47.10

## 2018-01-02 HISTORY — PX: CATARACT EXTRACTION W/PHACO: SHX586

## 2018-01-02 HISTORY — DX: Supraventricular tachycardia: I47.1

## 2018-01-02 HISTORY — DX: Chronic obstructive pulmonary disease, unspecified: J44.9

## 2018-01-02 SURGERY — PHACOEMULSIFICATION, CATARACT, WITH IOL INSERTION
Anesthesia: Monitor Anesthesia Care | Laterality: Right | Wound class: Clean

## 2018-01-02 MED ORDER — LIDOCAINE HCL (PF) 2 % IJ SOLN
INTRAOCULAR | Status: DC | PRN
  Administered 2018-01-02: 1 mL via INTRAMUSCULAR

## 2018-01-02 MED ORDER — ACETAMINOPHEN 160 MG/5ML PO SOLN: 325.0000 mg | ORAL | Status: DC | PRN

## 2018-01-02 MED ORDER — CEFUROXIME OPHTHALMIC INJECTION 1 MG/0.1 ML
INJECTION | OPHTHALMIC | Status: DC | PRN
  Administered 2018-01-02: 0.1 mL via OPHTHALMIC

## 2018-01-02 MED ORDER — ACETAMINOPHEN 325 MG PO TABS: 325.0000 mg | ORAL_TABLET | ORAL | Status: DC | PRN

## 2018-01-02 MED ORDER — EPINEPHRINE PF 1 MG/ML IJ SOLN
INTRAOCULAR | Status: DC | PRN
  Administered 2018-01-02: 63 mL via OPHTHALMIC

## 2018-01-02 MED ORDER — FENTANYL CITRATE (PF) 100 MCG/2ML IJ SOLN
INTRAMUSCULAR | Status: DC | PRN
  Administered 2018-01-02 (×2): 50 ug via INTRAVENOUS

## 2018-01-02 MED ORDER — NA HYALUR & NA CHOND-NA HYALUR 0.4-0.35 ML IO KIT
PACK | INTRAOCULAR | Status: DC | PRN
  Administered 2018-01-02: 1 mL via INTRAOCULAR

## 2018-01-02 MED ORDER — ARMC OPHTHALMIC DILATING DROPS
1.0000 "application " | OPHTHALMIC | Status: DC | PRN
  Administered 2018-01-02 (×3): 1 via OPHTHALMIC

## 2018-01-02 MED ORDER — MOXIFLOXACIN HCL 0.5 % OP SOLN
1.0000 [drp] | OPHTHALMIC | Status: DC | PRN
Start: 2018-01-02 — End: 2018-01-02
  Administered 2018-01-02 (×3): 1 [drp] via OPHTHALMIC

## 2018-01-02 MED ORDER — MIDAZOLAM HCL 2 MG/2ML IJ SOLN
INTRAMUSCULAR | Status: DC | PRN
  Administered 2018-01-02: 2 mg via INTRAVENOUS

## 2018-01-02 MED ORDER — BRIMONIDINE TARTRATE-TIMOLOL 0.2-0.5 % OP SOLN
OPHTHALMIC | Status: DC | PRN
  Administered 2018-01-02: 1 [drp] via OPHTHALMIC

## 2018-01-02 SURGICAL SUPPLY — 25 items
CANNULA ANT/CHMB 27GA (MISCELLANEOUS) ×3 IMPLANT
CARTRIDGE ABBOTT (MISCELLANEOUS) IMPLANT
GLOVE SURG LX 7.5 STRW (GLOVE) ×2
GLOVE SURG LX STRL 7.5 STRW (GLOVE) ×1 IMPLANT
GLOVE SURG TRIUMPH 8.0 PF LTX (GLOVE) ×3 IMPLANT
GOWN STRL REUS W/ TWL LRG LVL3 (GOWN DISPOSABLE) ×2 IMPLANT
GOWN STRL REUS W/TWL LRG LVL3 (GOWN DISPOSABLE) ×4
LENS IOL TECNIS ITEC 24.5 (Intraocular Lens) ×3 IMPLANT
MARKER SKIN DUAL TIP RULER LAB (MISCELLANEOUS) ×3 IMPLANT
NDL RETROBULBAR .5 NSTRL (NEEDLE) IMPLANT
NEEDLE FILTER BLUNT 18X 1/2SAF (NEEDLE) ×2
NEEDLE FILTER BLUNT 18X1 1/2 (NEEDLE) ×1 IMPLANT
PACK CATARACT BRASINGTON (MISCELLANEOUS) ×3 IMPLANT
PACK EYE AFTER SURG (MISCELLANEOUS) ×3 IMPLANT
PACK OPTHALMIC (MISCELLANEOUS) ×3 IMPLANT
RING MALYGIN 7.0 (MISCELLANEOUS) IMPLANT
SUT ETHILON 10-0 CS-B-6CS-B-6 (SUTURE)
SUT VICRYL  9 0 (SUTURE)
SUT VICRYL 9 0 (SUTURE) IMPLANT
SUTURE EHLN 10-0 CS-B-6CS-B-6 (SUTURE) IMPLANT
SYR 3ML LL SCALE MARK (SYRINGE) ×3 IMPLANT
SYR 5ML LL (SYRINGE) ×3 IMPLANT
SYR TB 1ML LUER SLIP (SYRINGE) ×3 IMPLANT
WATER STERILE IRR 500ML POUR (IV SOLUTION) ×3 IMPLANT
WIPE NON LINTING 3.25X3.25 (MISCELLANEOUS) ×3 IMPLANT

## 2018-01-02 NOTE — Transfer of Care (Signed)
Immediate Anesthesia Transfer of Care Note  Patient: Max Dixon  Procedure(s) Performed: CATARACT EXTRACTION PHACO AND INTRAOCULAR LENS PLACEMENT (IOC) RIGHT (Right )  Patient Location: PACU  Anesthesia Type: MAC  Level of Consciousness: awake, alert  and patient cooperative  Airway and Oxygen Therapy: Patient Spontanous Breathing and Patient connected to supplemental oxygen  Post-op Assessment: Post-op Vital signs reviewed, Patient's Cardiovascular Status Stable, Respiratory Function Stable, Patent Airway and No signs of Nausea or vomiting  Post-op Vital Signs: Reviewed and stable  Complications: No apparent anesthesia complications

## 2018-01-02 NOTE — Anesthesia Procedure Notes (Signed)
Procedure Name: MAC Date/Time: 01/02/2018 9:26 AM Performed by: Cameron Ali, CRNA Pre-anesthesia Checklist: Patient identified, Emergency Drugs available, Suction available, Timeout performed and Patient being monitored Patient Re-evaluated:Patient Re-evaluated prior to induction Oxygen Delivery Method: Nasal cannula Placement Confirmation: positive ETCO2

## 2018-01-02 NOTE — Anesthesia Postprocedure Evaluation (Signed)
Anesthesia Post Note  Patient: Max Dixon  Procedure(s) Performed: CATARACT EXTRACTION PHACO AND INTRAOCULAR LENS PLACEMENT (IOC) RIGHT (Right )  Patient location during evaluation: PACU Anesthesia Type: MAC Level of consciousness: awake and alert Pain management: pain level controlled Vital Signs Assessment: post-procedure vital signs reviewed and stable Respiratory status: spontaneous breathing, nonlabored ventilation, respiratory function stable and patient connected to nasal cannula oxygen Cardiovascular status: stable and blood pressure returned to baseline Postop Assessment: no apparent nausea or vomiting Anesthetic complications: no    Alisa Graff

## 2018-01-02 NOTE — Anesthesia Preprocedure Evaluation (Signed)
Anesthesia Evaluation  Patient identified by MRN, date of birth, ID band Patient awake    Reviewed: Allergy & Precautions, H&P , NPO status , Patient's Chart, lab work & pertinent test results, reviewed documented beta blocker date and time   Airway Mallampati: II  TM Distance: >3 FB Neck ROM: full    Dental no notable dental hx.    Pulmonary Current Smoker,    Pulmonary exam normal breath sounds clear to auscultation       Cardiovascular Exercise Tolerance: Good hypertension, negative cardio ROS  + dysrhythmias Atrial Fibrillation  Rhythm:regular Rate:Normal     Neuro/Psych negative neurological ROS  negative psych ROS   GI/Hepatic negative GI ROS, Neg liver ROS,   Endo/Other  Hypothyroidism   Renal/GU negative Renal ROS  negative genitourinary   Musculoskeletal   Abdominal   Peds  Hematology negative hematology ROS (+)   Anesthesia Other Findings   Reproductive/Obstetrics negative OB ROS                             Anesthesia Physical  Anesthesia Plan  ASA: II  Anesthesia Plan: MAC   Post-op Pain Management:    Induction:   PONV Risk Score and Plan:   Airway Management Planned:   Additional Equipment:   Intra-op Plan:   Post-operative Plan:   Informed Consent: I have reviewed the patients History and Physical, chart, labs and discussed the procedure including the risks, benefits and alternatives for the proposed anesthesia with the patient or authorized representative who has indicated his/her understanding and acceptance.   Dental Advisory Given  Plan Discussed with: CRNA  Anesthesia Plan Comments:         Anesthesia Quick Evaluation

## 2018-01-02 NOTE — Op Note (Signed)
LOCATION:  [facility]   PREOPERATIVE DIAGNOSIS:    Nuclear sclerotic cataract right eye. H25.11   POSTOPERATIVE DIAGNOSIS:  Nuclear sclerotic cataract right eye.     PROCEDURE:  Phacoemusification with posterior chamber intraocular lens placement of the right eye   LENS:   Implant Name Type Inv. Item Serial No. Manufacturer Lot No. LRB No. Used  LENS IOL DIOP 24.5 - [id number] Intraocular Lens LENS IOL DIOP 24.5 [id number]  Right 1        ULTRASOUND TIME: 9 % of 0 minutes, 31 seconds.  CDE 2.8   SURGEON:  Deirdre Evenerhadwick R. Joury Allcorn, MD   ANESTHESIA:  Topical with tetracaine drops and 2% Xylocaine jelly, augmented with 1% preservative-free intracameral lidocaine.    COMPLICATIONS:  None.   DESCRIPTION OF PROCEDURE:  The patient was identified in the holding room and transported to the operating room and placed in the supine position under the operating microscope.  The right eye was identified as the operative eye and it was prepped and draped in the usual sterile ophthalmic fashion.   A 1 millimeter clear-corneal paracentesis was made at the 12:00 position.  0.5 ml of preservative-free 1% lidocaine was injected into the anterior chamber. The anterior chamber was filled with Viscoat viscoelastic.  A 2.4 millimeter keratome was used to make a near-clear corneal incision at the 9:00 position.  A curvilinear capsulorrhexis was made with a cystotome and capsulorrhexis forceps.  Balanced salt solution was used to hydrodissect and hydrodelineate the nucleus.   Phacoemulsification was then used in stop and chop fashion to remove the lens nucleus and epinucleus.  The remaining cortex was then removed using the irrigation and aspiration handpiece. Provisc was then placed into the capsular bag to distend it for lens placement.  A lens was then injected into the capsular bag.  The remaining viscoelastic was aspirated.   Wounds were hydrated with balanced salt solution.  The anterior  chamber was inflated to a physiologic pressure with balanced salt solution.  No wound leaks were noted. Cefuroxime 0.1 ml of a 10mg /ml solution was injected into the anterior chamber for a dose of 1 mg of intracameral antibiotic at the completion of the case.   Timolol and Brimonidine drops were applied to the eye.  The patient was taken to the recovery room in stable condition without complications of anesthesia or surgery.   Max Dixon 01/02/2018, 9:46 AM

## 2018-01-02 NOTE — H&P (Signed)
The History and Physical notes are on paper, have been signed, and are to be scanned. The patient remains stable and unchanged from the H&P.   Previous H&P reviewed, patient examined, and there are no changes.  Max Dixon 01/02/2018 8:50 AM

## 2018-01-20 ENCOUNTER — Emergency Department (HOSPITAL_COMMUNITY)
Admission: EM | Admit: 2018-01-20 | Discharge: 2018-01-20 | Disposition: A | Discharge status: Home / Self Care | Payer: Managed Care, Other (non HMO) | Attending: Emergency Medicine | Admitting: Emergency Medicine

## 2018-01-20 ENCOUNTER — Encounter (HOSPITAL_COMMUNITY): Payer: Self-pay | Admitting: Emergency Medicine

## 2018-01-20 ENCOUNTER — Emergency Department (HOSPITAL_COMMUNITY): Payer: Managed Care, Other (non HMO)

## 2018-01-20 DIAGNOSIS — F17210 Nicotine dependence, cigarettes, uncomplicated: Secondary | ICD-10-CM | POA: Insufficient documentation

## 2018-01-20 DIAGNOSIS — E039 Hypothyroidism, unspecified: Secondary | ICD-10-CM | POA: Diagnosis not present

## 2018-01-20 DIAGNOSIS — T148XXA Other injury of unspecified body region, initial encounter: Secondary | ICD-10-CM

## 2018-01-20 DIAGNOSIS — S51852A Open bite of left forearm, initial encounter: Secondary | ICD-10-CM | POA: Diagnosis not present

## 2018-01-20 DIAGNOSIS — J449 Chronic obstructive pulmonary disease, unspecified: Secondary | ICD-10-CM | POA: Diagnosis not present

## 2018-01-20 DIAGNOSIS — Y999 Unspecified external cause status: Secondary | ICD-10-CM | POA: Diagnosis not present

## 2018-01-20 DIAGNOSIS — W5581XA Bitten by other mammals, initial encounter: Secondary | ICD-10-CM | POA: Diagnosis not present

## 2018-01-20 DIAGNOSIS — I110 Hypertensive heart disease with heart failure: Secondary | ICD-10-CM | POA: Insufficient documentation

## 2018-01-20 DIAGNOSIS — Y929 Unspecified place or not applicable: Secondary | ICD-10-CM | POA: Insufficient documentation

## 2018-01-20 DIAGNOSIS — Z79899 Other long term (current) drug therapy: Secondary | ICD-10-CM | POA: Insufficient documentation

## 2018-01-20 DIAGNOSIS — Z23 Encounter for immunization: Secondary | ICD-10-CM | POA: Diagnosis not present

## 2018-01-20 DIAGNOSIS — Y939 Activity, unspecified: Secondary | ICD-10-CM | POA: Diagnosis not present

## 2018-01-20 DIAGNOSIS — I509 Heart failure, unspecified: Secondary | ICD-10-CM | POA: Diagnosis not present

## 2018-01-20 DIAGNOSIS — S41112A Laceration without foreign body of left upper arm, initial encounter: Secondary | ICD-10-CM

## 2018-01-20 MED ORDER — AMOXICILLIN-POT CLAVULANATE 875-125 MG PO TABS: 1.0000 | ORAL_TABLET | Freq: Two times a day (BID) | ORAL | 0 refills | Status: DC

## 2018-01-20 MED ORDER — AMPICILLIN-SULBACTAM SODIUM 3 (2-1) G IJ SOLR
3.0000 g | Freq: Once | INTRAMUSCULAR | Status: AC
  Administered 2018-01-20: 3 g via INTRAVENOUS
  Filled 2018-01-20: qty 3

## 2018-01-20 MED ORDER — TRAMADOL HCL 50 MG PO TABS: 50.0000 mg | ORAL_TABLET | Freq: Four times a day (QID) | ORAL | 0 refills | Status: DC | PRN

## 2018-01-20 MED ORDER — TETANUS-DIPHTH-ACELL PERTUSSIS 5-2.5-18.5 LF-MCG/0.5 IM SUSP
0.5000 mL | Freq: Once | INTRAMUSCULAR | Status: AC
Start: 2018-01-20 — End: 2018-01-20
  Administered 2018-01-20: 0.5 mL via INTRAMUSCULAR
  Filled 2018-01-20: qty 0.5

## 2018-01-20 MED ORDER — LIDOCAINE-EPINEPHRINE (PF) 2 %-1:200000 IJ SOLN
20.0000 mL | Freq: Once | INTRAMUSCULAR | Status: AC
  Administered 2018-01-20: 20 mL
  Filled 2018-01-20: qty 20

## 2018-01-20 MED ORDER — FENTANYL CITRATE (PF) 100 MCG/2ML IJ SOLN
50.0000 ug | INTRAMUSCULAR | Status: DC | PRN
  Administered 2018-01-20: 50 ug via INTRAVENOUS
  Filled 2018-01-20: qty 2

## 2018-01-20 NOTE — ED Triage Notes (Signed)
Pt presents from home with [facility] for donkey bite to L forearm where he heard a "crunch", first responders noted significant bleeding on scene an applied a tourniquet (stayed on for about 5 mins, then removed); EMS noted weak thready pulses distally; pt was given fentanyl enroute On arrival, patient able to stand and pivot to ER stretcher, strong radial pulse present

## 2018-01-20 NOTE — ED Provider Notes (Signed)
[facility] EMERGENCY DEPARTMENT Provider Note   CSN: [id number] Arrival date & time: 01/20/18  1505     History   Chief Complaint Chief Complaint  Patient presents with  . Animal Bite    donkey    HPI Max Dixon is a 52 y.o. male.  Patient is a 52 year old male who presents after being bitten by [place].  He states just prior to arrival he was bitten to his left forearm by a dog E.  He states he heard a crunch.     Past Medical History:  Diagnosis Date  . A-fib (HCC)   . CHF (congestive heart failure) (HCC)   . COPD (chronic obstructive pulmonary disease) (HCC)   . Hyperlipidemia   . Hypertension   . Hypothyroidism   . Smokers' cough (HCC)   . SVT (supraventricular tachycardia) (HCC)    benign    Patient Active Problem List   Diagnosis Date Noted  . Umbilical hernia with obstruction, without gangrene   . Acute cholecystitis 10/06/2016  . 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 CATARACT EXTRACTION W/PHACO Left 12/12/2017   Procedure: CATARACT EXTRACTION PHACO AND INTRAOCULAR LENS PLACEMENT (IOC) LEFT;  Surgeon: Lockie Mola, MD;  Location: [facility];  Service: Ophthalmology;  Laterality: Left;  . CATARACT EXTRACTION W/PHACO Right 01/02/2018   Procedure: CATARACT EXTRACTION PHACO AND INTRAOCULAR LENS PLACEMENT (IOC) RIGHT;  Surgeon: Lockie Mola, MD;  Location: [facility];  Service: Ophthalmology;  Laterality: Right;  requests early  . CHOLECYSTECTOMY N/A 10/07/2016   Procedure: LAPAROSCOPIC CHOLECYSTECTOMY WITH INTRAOPERATIVE CHOLANGIOGRAM repair of umbilical hernia;  Surgeon: Leafy Ro, MD;  Location: [facility] ORS;  Service: General;  Laterality: N/A;  . ELECTROPHYSIOLOGIC STUDY N/A 09/11/2016   Procedure: SVT Ablation;  Surgeon: Will [name], MD;  Location: [facility] INVASIVE CV LAB;  Service: Cardiovascular;  Laterality: N/A;         Home Medications    Prior to Admission medications   Medication Sig Start Date End Date Taking? Authorizing Provider  amoxicillin-clavulanate (AUGMENTIN) 875-125 MG tablet Take 1 tablet by mouth 2 (two) times daily. One po bid x 7 days 01/20/18   Rolan Bucco, MD  budesonide-formoterol [facility]) 80-4.5 MCG/ACT inhaler Inhale 2 puffs into the lungs 2 (two) times daily as needed (shortness of breath).    [provider]  fenofibrate (TRICOR) 145 MG tablet Take 145 mg by mouth daily. 08/29/16   [provider]  ibuprofen (ADVIL,MOTRIN) 200 MG tablet Take 800 mg by mouth every 6 (six) hours as needed for headache or moderate pain.    [provider]  levothyroxine (SYNTHROID, LEVOTHROID) 175 MCG tablet Take 175 mcg by mouth every morning. 05/15/16   [provider]  lisinopril-hydrochlorothiazide (PRINZIDE,ZESTORETIC) 20-12.5 MG tablet Take 2 tablets by mouth daily. am 05/15/16   [provider]  magnesium oxide (MAG-OX) 400 MG tablet Take 400 mg by mouth daily.    [provider]  metoprolol tartrate (LOPRESSOR) 100 MG tablet Take 1 tablet (100 mg total) by mouth 2 (two) times daily. Patient taking differently: Take 100 mg by mouth daily. am 03/16/17 12/27/17  Regan Lemming, MD  traMADol (ULTRAM) 50 MG tablet Take 1 tablet (50 mg total) by mouth every 6 (six) hours as needed. 01/20/18   Rolan Bucco, MD    Family History Family History  Problem Relation Age of Onset  . Hypertension Father  Social History Social History   Tobacco Use  . Smoking status: Current Every Day Smoker    Packs/day: 2.00    Years: 43.00    Pack years: 86.00    Types: Cigarettes  . Smokeless tobacco: Never Used  . Tobacco comment: since age 76  Substance Use Topics  . Alcohol use: No  . Drug use: No     Allergies   Patient has no known allergies.   Review of Systems Review of Systems   Physical Exam Updated Vital  Signs There were no vitals taken for this visit.  Physical Exam  Musculoskeletal:  Patient has a large chunk of skin missing to the extensor surface of the left forearm.  There is an adjacent 2 cm C-shaped laceration.  There is also a 4 cm C-shaped laceration to the volar side of the forearm.  All of the lacerations are more toward the ulnar side of the arm.  Patient has diminished sensation to light touch to the ulnar side of the hands both on the extensor and flexor side of the hand.  He has inability to extend his fourth and fifth fingers.  He is able to move his thumb but has limited movement of the other fingers.  He has limited flexion but his main issue is inability to extend the fingers.         ED Treatments / Results  Labs (all labs ordered are listed, but only abnormal results are displayed) Labs Reviewed - No data to display  EKG None  Radiology Dg Forearm Left  Result Date: 01/20/2018 CLINICAL DATA:  Bitten by a donkey at the proximal LEFT forearm EXAM: LEFT FOREARM - 2 VIEW COMPARISON:  LEFT elbow radiographs 12/27/2013, LEFT wrist radiographs 09/07/2017 FINDINGS: Soft tissue swelling and irregularity with foci of soft tissue gas are identified at the lateral soft tissues at the elbow and distal forearm. No radiopaque foreign bodies. Osseous mineralization normal. Joint spaces preserved. No acute fracture, dislocation, or bone destruction. IMPRESSION: Soft tissue injury at the medial aspect of the LEFT elbow and proximal forearm. No acute osseous abnormalities. Electronically Signed   By: Ulyses Southward M.D.   On: 01/20/2018 15:57    Procedures Procedures (including critical care time)  Medications Ordered in ED Medications  fentaNYL (SUBLIMAZE) injection 50 mcg (50 mcg Intravenous Given 01/20/18 1532)  Tdap (BOOSTRIX) injection 0.5 mL (0.5 mLs Intramuscular Given 01/20/18 1721)  Ampicillin-Sulbactam (UNASYN) 3 g in sodium chloride 0.9 % 100 mL IVPB (3 g Intravenous New  Bag/Given 01/20/18 1721)  lidocaine-EPINEPHrine (XYLOCAINE W/EPI) 2 %-1:200000 (PF) injection 20 mL (20 mLs Infiltration Given 01/20/18 1722)     Initial Impression / Assessment and Plan / ED Course  I have reviewed the triage vital signs and the nursing notes.  Pertinent labs & imaging results that were available during my care of the patient were reviewed by me and considered in my medical decision making (see chart for details).     16:12 discussed with Dr. Everardo Pacific, on call for hand surgery, who recommends to wash out wounds, no sutures, xeroform dressing, splint, f/u in his office on Tuesday for a recheck.  Pt had TDAP updated, given IV unasyn, will start augmenin  Volar splint placed, sling given, advised to call tomorrow for an appointment on Tuesday  Final Clinical Impressions(s) / ED Diagnoses   Final diagnoses:  Animal bite  Laceration of multiple sites of left upper extremity, initial encounter    ED Discharge Orders  Ordered    amoxicillin-clavulanate (AUGMENTIN) 875-125 MG tablet  2 times daily     01/20/18 1751    traMADol (ULTRAM) 50 MG tablet  Every 6 hours PRN     01/20/18 1751       Rolan BuccoBelfi, Kielee Care, MD 01/20/18 1752

## 2018-01-20 NOTE — ED Notes (Signed)
Ortho at bedside placing splint

## 2018-01-20 NOTE — ED Notes (Signed)
Ortho paged. 

## 2018-01-20 NOTE — Progress Notes (Signed)
Orthopedic Tech Progress Note Patient Details:  Orson AloeJames R Cates 05/21/1966 [id number]  Ortho Devices Type of Ortho Device: Ace wrap, Arm sling, Volar splint Ortho Device/Splint Location: lue Ortho Device/Splint Interventions: Application   Post Interventions Patient Tolerated: Well Instructions Provided: Care of device   Nikki DomCrawford, Lakeya Mulka 01/20/2018, 6:05 PM

## 2018-01-23 ENCOUNTER — Other Ambulatory Visit: Payer: Self-pay

## 2018-01-23 DIAGNOSIS — I10 Essential (primary) hypertension: Secondary | ICD-10-CM

## 2018-01-23 DIAGNOSIS — E039 Hypothyroidism, unspecified: Secondary | ICD-10-CM

## 2018-01-23 DIAGNOSIS — E785 Hyperlipidemia, unspecified: Secondary | ICD-10-CM

## 2018-01-23 DIAGNOSIS — I519 Heart disease, unspecified: Principal | ICD-10-CM

## 2018-01-23 NOTE — Progress Notes (Deleted)
Subjective: Annual biometrics screening labs Patient presents for her annual biometric screening labs only, as she has had a comprehensive visit and physical exam with a primary care provider this year.  PCP: [facility] Patient denies any other issues or concerns.   Assessment Annual biometrics screening labs  Plan  Lipid panel pending. Encouraged routine visits with primary care provider.  Nonfasting blood sugar 104.  Discussed this with patient.

## 2018-01-23 NOTE — Addendum Note (Signed)
Addended by: Dollene PrimroseBURKE, Anwen Cannedy on: 01/23/2018 10:15 AM   Modules accepted: Level of Service

## 2018-01-23 NOTE — Addendum Note (Signed)
Addended by: Dollene PrimroseBURKE, Ankur Snowdon on: 01/23/2018 10:18 AM   Modules accepted: Orders

## 2018-01-24 LAB — COMPREHENSIVE METABOLIC PANEL
ALT: 45 IU/L — AB (ref 0–44)
AST: 39 IU/L (ref 0–40)
Albumin/Globulin Ratio: 1.4 (ref 1.2–2.2)
Albumin: 4.2 g/dL (ref 3.5–5.5)
Alkaline Phosphatase: 94 IU/L (ref 39–117)
BUN/Creatinine Ratio: 8 — ABNORMAL LOW (ref 9–20)
BUN: 9 mg/dL (ref 6–24)
Bilirubin Total: 0.9 mg/dL (ref 0.0–1.2)
CALCIUM: 9.5 mg/dL (ref 8.7–10.2)
CO2: 27 mmol/L (ref 20–29)
CREATININE: 1.16 mg/dL (ref 0.76–1.27)
Chloride: 91 mmol/L — ABNORMAL LOW (ref 96–106)
GFR calc Af Amer: 83 mL/min/{1.73_m2} (ref >59)
GFR calc non Af Amer: 71 mL/min/{1.73_m2} (ref >59)
GLUCOSE: 125 mg/dL — AB (ref 65–99)
Globulin, Total: 3.1 g/dL (ref 1.5–4.5)
Potassium: 4.7 mmol/L (ref 3.5–5.2)
SODIUM: 135 mmol/L (ref 134–144)
Total Protein: 7.3 g/dL (ref 6.0–8.5)

## 2018-01-24 LAB — CBC WITH DIFFERENTIAL/PLATELET
BASOS: 0 %
Basophils Absolute: 0 10*3/uL (ref 0.0–0.2)
EOS (ABSOLUTE): 0.4 10*3/uL (ref 0.0–0.4)
Eos: 5 %
Hematocrit: 41.5 % (ref 37.5–51.0)
Hemoglobin: 13.6 g/dL (ref 13.0–17.7)
Immature Grans (Abs): 0 10*3/uL (ref 0.0–0.1)
Immature Granulocytes: 0 %
LYMPHS ABS: 2.9 10*3/uL (ref 0.7–3.1)
LYMPHS: 35 %
MCH: 29.8 pg (ref 26.6–33.0)
MCHC: 32.8 g/dL (ref 31.5–35.7)
MCV: 91 fL (ref 79–97)
MONOCYTES: 8 %
Monocytes Absolute: 0.6 10*3/uL (ref 0.1–0.9)
NEUTROS ABS: 4.3 10*3/uL (ref 1.4–7.0)
Neutrophils: 52 %
Platelets: 295 10*3/uL (ref 150–379)
RBC: 4.56 x10E6/uL (ref 4.14–5.80)
RDW: 13.9 % (ref 12.3–15.4)
WBC: 8.3 10*3/uL (ref 3.4–10.8)

## 2018-01-24 LAB — LIPID PANEL
CHOL/HDL RATIO: 6 ratio — AB (ref 0.0–5.0)
Cholesterol, Total: 191 mg/dL (ref 100–199)
HDL: 32 mg/dL — ABNORMAL LOW (ref >39)
LDL CALC: 116 mg/dL — AB (ref 0–99)
Triglycerides: 215 mg/dL — ABNORMAL HIGH (ref 0–149)
VLDL CHOLESTEROL CAL: 43 mg/dL — AB (ref 5–40)

## 2018-01-24 LAB — TSH: TSH: 1.89 u[IU]/mL (ref 0.450–4.500)

## 2018-02-22 ENCOUNTER — Other Ambulatory Visit: Payer: Self-pay

## 2018-03-25 ENCOUNTER — Emergency Department
Admission: EM | Admit: 2018-03-25 | Discharge: 2018-03-25 | Disposition: A | Discharge status: Home / Self Care | Payer: Managed Care, Other (non HMO) | Attending: Emergency Medicine | Admitting: Emergency Medicine

## 2018-03-25 ENCOUNTER — Encounter: Payer: Self-pay | Admitting: Emergency Medicine

## 2018-03-25 ENCOUNTER — Emergency Department: Payer: Managed Care, Other (non HMO)

## 2018-03-25 ENCOUNTER — Other Ambulatory Visit: Payer: Self-pay

## 2018-03-25 DIAGNOSIS — H81392 Other peripheral vertigo, left ear: Secondary | ICD-10-CM | POA: Insufficient documentation

## 2018-03-25 DIAGNOSIS — F17210 Nicotine dependence, cigarettes, uncomplicated: Secondary | ICD-10-CM | POA: Diagnosis not present

## 2018-03-25 DIAGNOSIS — I110 Hypertensive heart disease with heart failure: Secondary | ICD-10-CM | POA: Insufficient documentation

## 2018-03-25 DIAGNOSIS — E039 Hypothyroidism, unspecified: Secondary | ICD-10-CM | POA: Insufficient documentation

## 2018-03-25 DIAGNOSIS — I509 Heart failure, unspecified: Secondary | ICD-10-CM | POA: Diagnosis not present

## 2018-03-25 DIAGNOSIS — R42 Dizziness and giddiness: Secondary | ICD-10-CM | POA: Diagnosis present

## 2018-03-25 DIAGNOSIS — J449 Chronic obstructive pulmonary disease, unspecified: Secondary | ICD-10-CM | POA: Diagnosis not present

## 2018-03-25 DIAGNOSIS — Z79899 Other long term (current) drug therapy: Secondary | ICD-10-CM | POA: Diagnosis not present

## 2018-03-25 DIAGNOSIS — H81399 Other peripheral vertigo, unspecified ear: Secondary | ICD-10-CM

## 2018-03-25 LAB — CBC
HCT: 44.2 % (ref 40.0–52.0)
HEMOGLOBIN: 15.4 g/dL (ref 13.0–18.0)
MCH: 30.8 pg (ref 26.0–34.0)
MCHC: 34.7 g/dL (ref 32.0–36.0)
MCV: 88.7 fL (ref 80.0–100.0)
Platelets: 282 10*3/uL (ref 150–440)
RBC: 4.98 MIL/uL (ref 4.40–5.90)
RDW: 13.7 % (ref 11.5–14.5)
WBC: 14.9 10*3/uL — AB (ref 3.8–10.6)

## 2018-03-25 LAB — BASIC METABOLIC PANEL
ANION GAP: 9 (ref 5–15)
BUN: 11 mg/dL (ref 6–20)
CHLORIDE: 95 mmol/L — AB (ref 101–111)
CO2: 28 mmol/L (ref 22–32)
Calcium: 8.7 mg/dL — ABNORMAL LOW (ref 8.9–10.3)
Creatinine, Ser: 0.89 mg/dL (ref 0.61–1.24)
GFR calc non Af Amer: >60 mL/min (ref >60)
Glucose, Bld: 120 mg/dL — ABNORMAL HIGH (ref 65–99)
Potassium: 3.7 mmol/L (ref 3.5–5.1)
SODIUM: 132 mmol/L — AB (ref 135–145)

## 2018-03-25 LAB — ETHANOL: Alcohol, Ethyl (B): <10 mg/dL (ref <10)

## 2018-03-25 LAB — LIPASE, BLOOD: LIPASE: 32 U/L (ref 11–51)

## 2018-03-25 LAB — HEPATIC FUNCTION PANEL
ALT: 24 U/L (ref 17–63)
AST: 26 U/L (ref 15–41)
Albumin: 4.1 g/dL (ref 3.5–5.0)
Alkaline Phosphatase: 72 U/L (ref 38–126)
Total Bilirubin: 0.9 mg/dL (ref 0.3–1.2)
Total Protein: 8.1 g/dL (ref 6.5–8.1)

## 2018-03-25 LAB — TROPONIN I

## 2018-03-25 MED ORDER — MECLIZINE HCL 25 MG PO TABS
50.0000 mg | ORAL_TABLET | Freq: Once | ORAL | Status: AC
  Administered 2018-03-25: 50 mg via ORAL
  Filled 2018-03-25: qty 2

## 2018-03-25 MED ORDER — MECLIZINE HCL 25 MG PO TABS: 25.0000 mg | ORAL_TABLET | Freq: Three times a day (TID) | ORAL | 0 refills | Status: DC | PRN

## 2018-03-25 NOTE — ED Triage Notes (Signed)
FIRST NURSE NOTE-arrived [facility] for near syncope.  Has had dizziness on and off for 2 months. sats 90% RA with EMS.  BG 121 with EMS. One episode vomiting per EMS.

## 2018-03-25 NOTE — ED Notes (Signed)
Pt unable to urinate at this time. Pt stated "I can pee if you give me something to drink." Advised pt that he could not have anything to drink until he sees MD. Pt given specimen cup for when is able to void and wheeled back out to the lobby.

## 2018-03-25 NOTE — Discharge Instructions (Signed)
It was a pleasure to take care of you today, and thank you for coming to our emergency department.  If you have any questions or concerns before leaving please ask the nurse to grab me and I'm more than happy to go through your aftercare instructions again.  If you were prescribed any opioid pain medication today such as Norco, Vicodin, Percocet, morphine, hydrocodone, or oxycodone please make sure you do not drive when you are taking this medication as it can alter your ability to drive safely.  If you have any concerns once you are home that you are not improving or are in fact getting worse before you can make it to your follow-up appointment, please do not hesitate to call 911 and come back for further evaluation.  Merrily Brittle, MD  Results for orders placed or performed during the hospital encounter of 03/25/18  Basic metabolic panel  Result Value Ref Range   Sodium 132 (L) 135 - 145 mmol/L   Potassium 3.7 3.5 - 5.1 mmol/L   Chloride 95 (L) 101 - 111 mmol/L   CO2 28 22 - 32 mmol/L   Glucose, Bld 120 (H) 65 - 99 mg/dL   BUN 11 6 - 20 mg/dL   Creatinine, Ser 1.61 0.61 - 1.24 mg/dL   Calcium 8.7 (L) 8.9 - 10.3 mg/dL   GFR calc non Af Amer >60 >60 mL/min   GFR calc Af Amer >60 >60 mL/min   Anion gap 9 5 - 15  CBC  Result Value Ref Range   WBC 14.9 (H) 3.8 - 10.6 K/uL   RBC 4.98 4.40 - 5.90 MIL/uL   Hemoglobin 15.4 13.0 - 18.0 g/dL   HCT 09.6 04.5 - 40.9 %   MCV 88.7 80.0 - 100.0 fL   MCH 30.8 26.0 - 34.0 pg   MCHC 34.7 32.0 - 36.0 g/dL   RDW 81.1 91.4 - 78.2 %   Platelets 282 150 - 440 K/uL  Troponin I  Result Value Ref Range   Troponin I <0.03 <0.03 ng/mL  Lipase, blood  Result Value Ref Range   Lipase 32 11 - 51 U/L  Hepatic function panel  Result Value Ref Range   Total Protein 8.1 6.5 - 8.1 g/dL   Albumin 4.1 3.5 - 5.0 g/dL   AST 26 15 - 41 U/L   ALT 24 17 - 63 U/L   Alkaline Phosphatase 72 38 - 126 U/L   Total Bilirubin 0.9 0.3 - 1.2 mg/dL   Bilirubin, Direct  <9.5 (L) 0.1 - 0.5 mg/dL   Indirect Bilirubin NOT CALCULATED 0.3 - 0.9 mg/dL  Ethanol  Result Value Ref Range   Alcohol, Ethyl (B) <10 <10 mg/dL   Ct Head Wo Contrast  Result Date: 03/25/2018 CLINICAL DATA:  Vertigo, dizziness on off for 2 months EXAM: CT HEAD WITHOUT CONTRAST TECHNIQUE: Contiguous axial images were obtained from the base of the skull through the vertex without intravenous contrast. COMPARISON:  None. FINDINGS: Brain: No evidence of acute infarction, hemorrhage, hydrocephalus, extra-axial collection or mass lesion/mass effect. Vascular: No hyperdense vessel or unexpected calcification. Skull: No osseous abnormality. Sinuses/Orbits: Visualized paranasal sinuses are clear. Visualized mastoid sinuses are clear. Visualized orbits demonstrate no focal abnormality. Other: None IMPRESSION: No acute intracranial pathology. Electronically Signed   By: Elige Ko   On: 03/25/2018 16:11   Mr Brain Wo Contrast (neuro Protocol)  Result Date: 03/25/2018 CLINICAL DATA:  Dizziness and vertigo 2 months. EXAM: MRI HEAD WITHOUT CONTRAST TECHNIQUE: Multiplanar, multiecho pulse  sequences of the brain and surrounding structures were obtained without intravenous contrast. COMPARISON:  CT head earlier today. FINDINGS: Brain: No acute infarction, hemorrhage, hydrocephalus, extra-axial collection or mass lesion. Slight premature for age cerebral and cerebellar atrophy. Mild subcortical and periventricular T2 and FLAIR hyperintensities, likely chronic microvascular ischemic change. Vascular: Flow voids are maintained throughout the carotid, basilar, and vertebral arteries. There are no areas of chronic hemorrhage. Skull and upper cervical spine: Unremarkable visualized calvarium, skullbase, and cervical vertebrae. Pituitary, pineal, cerebellar tonsils unremarkable. No upper cervical cord lesions. Sinuses/Orbits: No orbital masses or proptosis. Globes appear symmetric. Sinuses appear well aerated, without  evidence for air-fluid level. Other: None. IMPRESSION: Mild atrophy and small vessel disease. No acute stroke. No vascular occlusion. Electronically Signed   By: Elsie Stain M.D.   On: 03/25/2018 17:22   Dg Chest Port 1 View  Result Date: 03/25/2018 CLINICAL DATA:  Shortness of breath EXAM: PORTABLE CHEST 1 VIEW COMPARISON:  10/06/2016 FINDINGS: The heart size and mediastinal contours are within normal limits. Both lungs are clear. The visualized skeletal structures are unremarkable. IMPRESSION: No active disease. Electronically Signed   By: Elige Ko   On: 03/25/2018 15:50

## 2018-03-25 NOTE — ED Provider Notes (Signed)
[facility] Emergency Department Provider Note  ____________________________________________   First MD Initiated Contact with Patient 03/25/18 1518     (approximate)  I have reviewed the triage vital signs and the nursing notes.   HISTORY  Chief Complaint Dizziness    HPI Max Dixon is a 52 y.o. male who comes to the emergency department with several days of intermittent dizziness.  His wife describes it as "it is almost like he is drunk".  He has had brief episodes of slurred speech.  The patient denies drinking alcohol.  His dizziness is described as room spinning associated with nausea.  Some "buzzing" in his left ear.  No pain.  Nothing seems to make the symptoms better or worse.  No double vision or blurred vision.  No history of similar.  Past Medical History:  Diagnosis Date  . A-fib (HCC)   . CHF (congestive heart failure) (HCC)   . COPD (chronic obstructive pulmonary disease) (HCC)   . Hyperlipidemia   . Hypertension   . Hypothyroidism   . Smokers' cough (HCC)   . SVT (supraventricular tachycardia) (HCC)    benign    Patient Active Problem List   Diagnosis Date Noted  . Umbilical hernia with obstruction, without gangrene   . Acute cholecystitis 10/06/2016  . 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 CATARACT EXTRACTION W/PHACO Left 12/12/2017   Procedure: CATARACT EXTRACTION PHACO AND INTRAOCULAR LENS PLACEMENT (IOC) LEFT;  Surgeon: Lockie Mola, MD;  Location: [facility];  Service: Ophthalmology;  Laterality: Left;  . CATARACT EXTRACTION W/PHACO Right 01/02/2018   Procedure: CATARACT EXTRACTION PHACO AND INTRAOCULAR LENS PLACEMENT (IOC) RIGHT;  Surgeon: Lockie Mola, MD;  Location: [facility];  Service: Ophthalmology;  Laterality: Right;  requests early  . CHOLECYSTECTOMY N/A 10/07/2016   Procedure: LAPAROSCOPIC CHOLECYSTECTOMY  WITH INTRAOPERATIVE CHOLANGIOGRAM repair of umbilical hernia;  Surgeon: Leafy Ro, MD;  Location: [facility] ORS;  Service: General;  Laterality: N/A;  . ELECTROPHYSIOLOGIC STUDY N/A 09/11/2016   Procedure: SVT Ablation;  Surgeon: Will [name], MD;  Location: [facility];  Service: Cardiovascular;  Laterality: N/A;    Prior to Admission medications   Medication Sig Start Date End Date Taking? Authorizing Provider  amoxicillin-clavulanate (AUGMENTIN) 875-125 MG tablet Take 1 tablet by mouth 2 (two) times daily. One po bid x 7 days 01/20/18   Rolan Bucco, MD  levothyroxine (SYNTHROID, LEVOTHROID) 175 MCG tablet Take 175 mcg by mouth every morning. 05/15/16   [provider]  lisinopril-hydrochlorothiazide (PRINZIDE,ZESTORETIC) 20-12.5 MG tablet Take 2 tablets by mouth daily. am 05/15/16   [provider]  Magnesium Oxide 250 MG TABS Take 250 mg by mouth daily.     [provider]  meclizine (ANTIVERT) 25 MG tablet Take 1 tablet (25 mg total) by mouth 3 (three) times daily as needed for dizziness. 03/25/18   Merrily Brittle, MD  metoprolol tartrate (LOPRESSOR) 100 MG tablet Take 1 tablet (100 mg total) by mouth 2 (two) times daily. Patient taking differently: Take 100 mg by mouth daily. am 03/16/17 01/20/18  Regan Lemming, MD  traMADol (ULTRAM) 50 MG tablet Take 1 tablet (50 mg total) by mouth every 6 (six) hours as needed. 01/20/18   Rolan Bucco, MD    Allergies Patient has no known allergies.  Family History  Problem Relation Age of Onset  . Hypertension Father     Social History Social  History   Tobacco Use  . Smoking status: Current Every Day Smoker    Packs/day: 2.00    Years: 43.00    Pack years: 86.00    Types: Cigarettes  . Smokeless tobacco: Never Used  . Tobacco comment: since age 60  Substance Use Topics  . Alcohol use: No  . Drug use: No    Review of Systems Constitutional: No fever/chills Eyes: No visual changes. ENT:  No sore throat. Cardiovascular: Denies chest pain. Respiratory: Denies shortness of breath. Gastrointestinal: No abdominal pain.  Positive for nausea, no vomiting.  No diarrhea.  No constipation. Genitourinary: Negative for dysuria. Musculoskeletal: Negative for back pain. Skin: Negative for rash. Neurological: Positive for vertigo   ____________________________________________   PHYSICAL EXAM:  VITAL SIGNS: ED Triage Vitals  Enc Vitals Group     BP 03/25/18 1408 130/80     Pulse Rate 03/25/18 1408 67     Resp 03/25/18 1408 18     Temp 03/25/18 1408 98.4 F (36.9 C)     Temp Source 03/25/18 1408 Oral     SpO2 03/25/18 1408 99 %     Weight 03/25/18 1121 193 lb (87.5 kg)     Height 03/25/18 1121  (1.803 m)     Head Circumference --      Peak Flow --      Pain Score 03/25/18 1121 0     Pain Loc --      Pain Edu? --      Excl. in GC? --     Constitutional: Alert and oriented x4 pleasant cooperative speaks full clear sentences no diaphoresis Eyes: PERRL EOMI. lateral fatigable nystagmus on leftward gaze Head: Atraumatic.  Normal tympanic membranes bilaterally Nose: No congestion/rhinnorhea. Mouth/Throat: No trismus Neck: No stridor.   Cardiovascular: Normal rate, regular rhythm. Grossly normal heart sounds.  Good peripheral circulation. Respiratory: Normal respiratory effort.  No retractions. Lungs CTAB and moving good air Gastrointestinal: Soft nontender Musculoskeletal: No lower extremity edema   Neurologic:  Normal speech and language. No gross focal neurologic deficits are appreciated. Normal finger-nose-finger no dysdiadochokinesis Skin:  Skin is warm, dry and intact. No rash noted. Psychiatric: Mood and affect are normal. Speech and behavior are normal.    ____________________________________________   DIFFERENTIAL includes but not limited to  Central vertigo, peripheral vertigo, migraine headache ____________________________________________    LABS (all labs ordered are listed, but only abnormal results are displayed)  Labs Reviewed  BASIC METABOLIC PANEL - Abnormal; Notable for the following components:      Result Value   Sodium 132 (*)    Chloride 95 (*)    Glucose, Bld 120 (*)    Calcium 8.7 (*)    All other components within normal limits  CBC - Abnormal; Notable for the following components:   WBC 14.9 (*)    All other components within normal limits  HEPATIC FUNCTION PANEL - Abnormal; Notable for the following components:   Bilirubin, Direct <0.1 (*)    All other components within normal limits  TROPONIN I  LIPASE, BLOOD  ETHANOL    Lab work reviewed by me with no acute disease __________________________________________  EKG  ED ECG REPORT I, Merrily Brittle, the attending physician, personally viewed and interpreted this ECG.  Date: 03/25/2018 EKG Time:  Rate: 60 Rhythm: normal sinus rhythm QRS Axis: normal Intervals: normal ST/T Wave abnormalities: normal Narrative Interpretation: no evidence of acute ischemia  ____________________________________________  RADIOLOGY  MRI of the brain reviewed by me with  no acute disease ____________________________________________   PROCEDURES  Procedure(s) performed: no  Procedures  Critical Care performed: no  Observation: no ____________________________________________   INITIAL IMPRESSION / ASSESSMENT AND PLAN / ED COURSE  Pertinent labs & imaging results that were available during my care of the patient were reviewed by me and considered in my medical decision making (see chart for details).  The patient's symptoms are consistent with vertigo but unclear if central or peripheral.  He certainly has fatigable nystagmus and a normal finger-nose-finger however intermittent slurred speech raises concern for central etiology.  MRI is pending.  MRI is fortunately reassuring with no evidence of stroke.  He feels improved after meclizine.  We will  discharge him home with primary care follow-up.  The patient and his wife verbalized understanding and agreement with plan.      ____________________________________________   FINAL CLINICAL IMPRESSION(S) / ED DIAGNOSES  Final diagnoses:  Peripheral vertigo, unspecified laterality      NEW MEDICATIONS STARTED DURING THIS VISIT:  Discharge Medication List as of 03/25/2018  5:31 PM    START taking these medications   Details  meclizine (ANTIVERT) 25 MG tablet Take 1 tablet (25 mg total) by mouth 3 (three) times daily as needed for dizziness., Starting Mon 03/25/2018, Print         Note:  This document was prepared using Dragon voice recognition software and may include unintentional dictation errors.     Merrily Brittle, MD 03/25/18 2140

## 2018-03-25 NOTE — ED Triage Notes (Signed)
FIRST NURSE NOTE-sats 95% RA at first nurse desk. Unlabored. Alert.

## 2018-06-04 ENCOUNTER — Telehealth: Payer: Self-pay | Admitting: Cardiology

## 2018-06-04 NOTE — Telephone Encounter (Signed)
Wife is going to fax paperwork from DOT physician.  She would like Dr. Elberta Fortisamnitz to fill in the areas requested by the DOT physician. She will fax it to [phone]

## 2018-06-04 NOTE — Telephone Encounter (Signed)
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 Advise pt's wife that he would need to come in office and she stated he just need a letter to get approved to drive.

## 2018-06-10 NOTE — Telephone Encounter (Signed)
Wife aware I will fax signed information [facility]. I also faxed a copy to her at [phone]. Wife appreciative of our help

## 2018-06-10 NOTE — Telephone Encounter (Signed)
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

## 2018-06-14 ENCOUNTER — Encounter: Payer: Self-pay | Admitting: Emergency Medicine

## 2018-06-14 ENCOUNTER — Other Ambulatory Visit: Payer: Self-pay

## 2018-06-14 DIAGNOSIS — H6592 Unspecified nonsuppurative otitis media, left ear: Secondary | ICD-10-CM | POA: Diagnosis not present

## 2018-06-14 DIAGNOSIS — I110 Hypertensive heart disease with heart failure: Secondary | ICD-10-CM | POA: Diagnosis not present

## 2018-06-14 DIAGNOSIS — F17210 Nicotine dependence, cigarettes, uncomplicated: Secondary | ICD-10-CM | POA: Insufficient documentation

## 2018-06-14 DIAGNOSIS — I509 Heart failure, unspecified: Secondary | ICD-10-CM | POA: Insufficient documentation

## 2018-06-14 DIAGNOSIS — H9202 Otalgia, left ear: Secondary | ICD-10-CM | POA: Diagnosis present

## 2018-06-14 DIAGNOSIS — Z79899 Other long term (current) drug therapy: Secondary | ICD-10-CM | POA: Insufficient documentation

## 2018-06-14 DIAGNOSIS — E039 Hypothyroidism, unspecified: Secondary | ICD-10-CM | POA: Insufficient documentation

## 2018-06-14 NOTE — ED Triage Notes (Signed)
Pt reports earache past 2 days left ear denies any injuries to area. Pt talks in complete sentences no distress noted

## 2018-06-15 ENCOUNTER — Emergency Department
Admission: EM | Admit: 2018-06-15 | Discharge: 2018-06-15 | Disposition: A | Discharge status: Home / Self Care | Payer: Managed Care, Other (non HMO) | Attending: Emergency Medicine | Admitting: Emergency Medicine

## 2018-06-15 DIAGNOSIS — H6592 Unspecified nonsuppurative otitis media, left ear: Secondary | ICD-10-CM

## 2018-06-15 MED ORDER — AMOXICILLIN 500 MG PO CAPS
500.0000 mg | ORAL_CAPSULE | Freq: Once | ORAL | Status: AC
  Administered 2018-06-15: 500 mg via ORAL
  Filled 2018-06-15: qty 1

## 2018-06-15 MED ORDER — AMOXICILLIN 500 MG PO CAPS: 500.0000 mg | ORAL_CAPSULE | Freq: Three times a day (TID) | ORAL | 0 refills | Status: AC

## 2018-06-15 NOTE — ED Provider Notes (Signed)
[facility] Emergency Department Provider Note  ____________________________________________   I have reviewed the triage vital signs and the nursing notes. Where available I have reviewed prior notes and, if possible and indicated, outside hospital notes.    HISTORY  Chief Complaint Otalgia (left ear )    HPI Max Dixon is a 52 y.o. male resents today complaining of left ear pain for 2 days.  No fever no other symptoms.  Try putting tea tree oil and there was no relief.  No other alleviating or aggravating symptoms is a sharp discomfort.  No loss of hearing.     Past Medical History:  Diagnosis Date  . A-fib (HCC)   . CHF (congestive heart failure) (HCC)   . COPD (chronic obstructive pulmonary disease) (HCC)   . Hyperlipidemia   . Hypertension   . Hypothyroidism   . Smokers' cough (HCC)   . SVT (supraventricular tachycardia) (HCC)    benign    Patient Active Problem List   Diagnosis Date Noted  . Umbilical hernia with obstruction, without gangrene   . Acute cholecystitis 10/06/2016  . 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 CATARACT EXTRACTION W/PHACO Left 12/12/2017   Procedure: CATARACT EXTRACTION PHACO AND INTRAOCULAR LENS PLACEMENT (IOC) LEFT;  Surgeon: Lockie MolaBrasington, Chadwick, MD;  [facility];  Service: Ophthalmology;  Laterality: Left;  . CATARACT EXTRACTION W/PHACO Right 01/02/2018   Procedure: CATARACT EXTRACTION PHACO AND INTRAOCULAR LENS PLACEMENT (IOC) RIGHT;  Surgeon: Lockie MolaBrasington, Chadwick, MD;  [facility];  Service: Ophthalmology;  Laterality: Right;  requests early  . CHOLECYSTECTOMY N/A 10/07/2016   Procedure: LAPAROSCOPIC CHOLECYSTECTOMY WITH INTRAOPERATIVE CHOLANGIOGRAM repair of umbilical hernia;  Surgeon: Leafy Roiego F Pabon, MD;  Location: [facility] ORS;  Service: General;  Laterality: N/A;  . ELECTROPHYSIOLOGIC STUDY N/A  09/11/2016   Procedure: SVT Ablation;  Surgeon: Will Jorja LoaMartin Camnitz, MD;  Location: [facility];  Service: Cardiovascular;  Laterality: N/A;    Prior to Admission medications   Medication Sig Start Date End Date Taking? Authorizing Provider  amoxicillin-clavulanate (AUGMENTIN) 875-125 MG tablet Take 1 tablet by mouth 2 (two) times daily. One po bid x 7 days 01/20/18   Rolan BuccoBelfi, Melanie, MD  levothyroxine (SYNTHROID, LEVOTHROID) 175 MCG tablet Take 175 mcg by mouth every morning. 05/15/16   [provider]  lisinopril-hydrochlorothiazide (PRINZIDE,ZESTORETIC) 20-12.5 MG tablet Take 2 tablets by mouth daily. am 05/15/16   [provider]  Magnesium Oxide 250 MG TABS Take 250 mg by mouth daily.     [provider]  meclizine (ANTIVERT) 25 MG tablet Take 1 tablet (25 mg total) by mouth 3 (three) times daily as needed for dizziness. 03/25/18   Merrily Brittleifenbark, Neil, MD  metoprolol tartrate (LOPRESSOR) 100 MG tablet Take 1 tablet (100 mg total) by mouth 2 (two) times daily. Patient taking differently: Take 100 mg by mouth daily. am 03/16/17 01/20/18  Regan Lemmingamnitz, Will Martin, MD  traMADol (ULTRAM) 50 MG tablet Take 1 tablet (50 mg total) by mouth every 6 (six) hours as needed. 01/20/18   Rolan BuccoBelfi, Melanie, MD    Allergies Patient has no known allergies.  Family History  Problem Relation Age of Onset  . Hypertension Father     Social History Social History   Tobacco Use  . Smoking status: Current Every Day Smoker    Packs/day: 2.00    Years: 43.00    Pack years: 86.00    Types:  Cigarettes  . Smokeless tobacco: Never Used  . Tobacco comment: since age 52  Substance Use Topics  . Alcohol use: No  . Drug use: No    Review of Systems The HPI   ____________________________________________   PHYSICAL EXAM:  VITAL SIGNS: ED Triage Vitals  Enc Vitals Group     BP 06/14/18 2319 (!) 155/86     Pulse Rate 06/14/18 2319 71     Resp 06/14/18 2319 20     Temp 06/14/18  2319 98.3 F (36.8 C)     Temp Source 06/14/18 2319 Oral     SpO2 06/14/18 2319 99 %     Weight 06/14/18 2320 190 lb (86.2 kg)     Height 06/14/18 2320 5\' 11"  (1.803 m)     Head Circumference --      Peak Flow --      Pain Score 06/14/18 2320 10     Pain Loc --      Pain Edu? --      Excl. in GC? --     Constitutional: Alert and oriented. Well appearing and in no acute distress. Eyes: Conjunctivae are normal Head: Atraumatic HEENT: No congestion/rhinnorhea. Mucous membranes are moist.  Oropharynx non-erythematous, no lymphadenopathy noted.  TM is normal left TM is erythematous and swollen.  No perforation.  Some mild irritation in the EAC without evidence of significant otitis externa. Neck:   Nontender with no meningismus, no masses, no stridor Cardiovascular: Normal rate, regular rhythm. Grossly normal heart sounds.  Good peripheral circulation. Respiratory: Normal respiratory effort.  No retractions. Lungs CTAB.  Skin:  Skin is warm, dry and intact. No rash noted. Psychiatric: Mood and affect are normal. Speech and behavior are normal.  ____________________________________________   LABS (all labs ordered are listed, but only abnormal results are displayed)  Labs Reviewed - No data to display  Pertinent labs  results that were available during my care of the patient were reviewed by me and considered in my medical decision making (see chart for details). ____________________________________________  EKG  I personally interpreted any EKGs ordered by me or triage  ____________________________________________  RADIOLOGY  Pertinent labs & imaging results that were available during my care of the patient were reviewed by me and considered in my medical decision making (see chart for details). If possible, patient and/or family made aware of any abnormal findings.  No results found. ____________________________________________    PROCEDURES  Procedure(s) performed:  None  Procedures  Critical Care performed: None  ____________________________________________   INITIAL IMPRESSION / ASSESSMENT AND PLAN / ED COURSE  Pertinent labs & imaging results that were available during my care of the patient were reviewed by me and considered in my medical decision making (see chart for details).  With evidence of a otitis media we will treat him with antibiotics, will refer to ENT given his age no other symptoms noted.  Return precautions follow-up given understood.  Did counsel the patient not to smoke.    ____________________________________________   FINAL CLINICAL IMPRESSION(S) / ED DIAGNOSES  Final diagnoses:  None      This chart was dictated using voice recognition software.  Despite best efforts to proofread,  errors can occur which can change meaning.      Jeanmarie PlantMcShane, Georgie A, MD 06/15/18 [phone]

## 2018-08-15 ENCOUNTER — Other Ambulatory Visit: Payer: Self-pay

## 2018-08-15 DIAGNOSIS — E039 Hypothyroidism, unspecified: Secondary | ICD-10-CM

## 2018-08-15 DIAGNOSIS — I10 Essential (primary) hypertension: Secondary | ICD-10-CM

## 2018-08-15 DIAGNOSIS — E559 Vitamin D deficiency, unspecified: Secondary | ICD-10-CM

## 2018-08-15 DIAGNOSIS — E785 Hyperlipidemia, unspecified: Secondary | ICD-10-CM

## 2018-08-15 DIAGNOSIS — E669 Obesity, unspecified: Secondary | ICD-10-CM

## 2018-08-16 LAB — COMPREHENSIVE METABOLIC PANEL
A/G RATIO: 1.5 (ref 1.2–2.2)
ALBUMIN: 4.5 g/dL (ref 3.5–5.5)
ALT: 27 IU/L (ref 0–44)
AST: 23 IU/L (ref 0–40)
Alkaline Phosphatase: 68 IU/L (ref 39–117)
BILIRUBIN TOTAL: 0.6 mg/dL (ref 0.0–1.2)
BUN / CREAT RATIO: 7 — AB (ref 9–20)
BUN: 7 mg/dL (ref 6–24)
CHLORIDE: 94 mmol/L — AB (ref 96–106)
CO2: 27 mmol/L (ref 20–29)
Calcium: 9.6 mg/dL (ref 8.7–10.2)
Creatinine, Ser: 1.01 mg/dL (ref 0.76–1.27)
GFR calc non Af Amer: 84 mL/min/{1.73_m2} (ref >59)
GFR, EST AFRICAN AMERICAN: 97 mL/min/{1.73_m2} (ref >59)
Globulin, Total: 3 g/dL (ref 1.5–4.5)
Glucose: 79 mg/dL (ref 65–99)
POTASSIUM: 4.1 mmol/L (ref 3.5–5.2)
Sodium: 138 mmol/L (ref 134–144)
TOTAL PROTEIN: 7.5 g/dL (ref 6.0–8.5)

## 2018-08-16 LAB — CBC WITH DIFFERENTIAL/PLATELET
BASOS: 1 %
Basophils Absolute: 0.1 10*3/uL (ref 0.0–0.2)
EOS (ABSOLUTE): 0.2 10*3/uL (ref 0.0–0.4)
Eos: 2 %
HEMOGLOBIN: 15.9 g/dL (ref 13.0–17.7)
Hematocrit: 45.9 % (ref 37.5–51.0)
Immature Grans (Abs): 0.1 10*3/uL (ref 0.0–0.1)
Immature Granulocytes: 1 %
LYMPHS ABS: 2.4 10*3/uL (ref 0.7–3.1)
Lymphs: 24 %
MCH: 31 pg (ref 26.6–33.0)
MCHC: 34.6 g/dL (ref 31.5–35.7)
MCV: 90 fL (ref 79–97)
MONOCYTES: 6 %
MONOS ABS: 0.7 10*3/uL (ref 0.1–0.9)
Neutrophils Absolute: 6.8 10*3/uL (ref 1.4–7.0)
Neutrophils: 66 %
Platelets: 235 10*3/uL (ref 150–450)
RBC: 5.13 x10E6/uL (ref 4.14–5.80)
RDW: 14 % (ref 12.3–15.4)
WBC: 10.2 10*3/uL (ref 3.4–10.8)

## 2018-08-16 LAB — LIPID PANEL
Chol/HDL Ratio: 8.9 ratio — ABNORMAL HIGH (ref 0.0–5.0)
Cholesterol, Total: 239 mg/dL — ABNORMAL HIGH (ref 100–199)
HDL: 27 mg/dL — AB (ref >39)
TRIGLYCERIDES: 417 mg/dL — AB (ref 0–149)

## 2018-08-16 LAB — TSH: TSH: 9.73 u[IU]/mL — AB (ref 0.450–4.500)

## 2018-08-16 LAB — HGB A1C W/O EAG: Hgb A1c MFr Bld: 5.8 % — ABNORMAL HIGH (ref 4.8–5.6)

## 2018-08-16 LAB — VITAMIN D 25 HYDROXY (VIT D DEFICIENCY, FRACTURES): VIT D 25 HYDROXY: 11.7 ng/mL — AB (ref 30.0–100.0)

## 2018-08-16 NOTE — Progress Notes (Addendum)
Lab Results from 08/15/18 routed to ordering provider Gillermo Murdoch MD 08/16/18 Erskine Squibb Jashawn Floyd CMA

## 2018-08-22 ENCOUNTER — Other Ambulatory Visit: Payer: Self-pay

## 2018-10-31 IMAGING — US US ABDOMEN LIMITED
1 series · 14 of 25 positions shown · non-contrast
Comparison: CT abdomen/ pelvis earlier this day demonstrating
gallbladder inflammation and gallstones.

CLINICAL DATA: Right upper quadrant pain for 2 days.

EXAM:
US ABDOMEN LIMITED - RIGHT UPPER QUADRANT

[Series 1: us abdomen limited · 0.28mm/px · 14 of 56 slices shown]
[im 1/56]
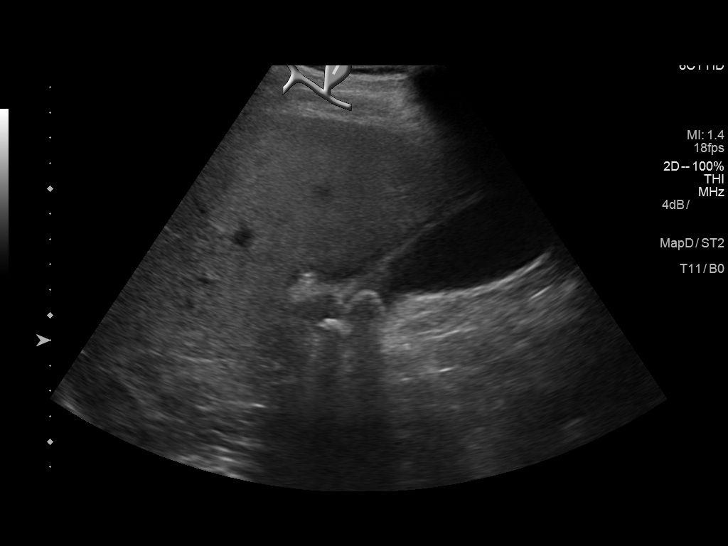
[im 5/56]
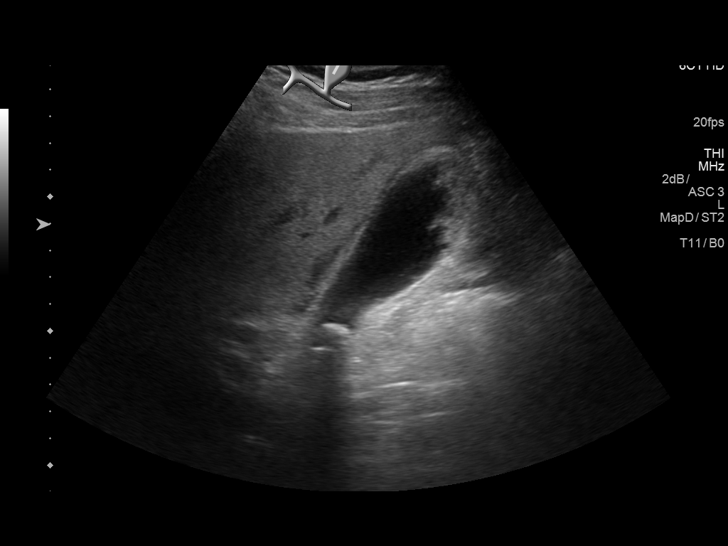
[im 10/56]
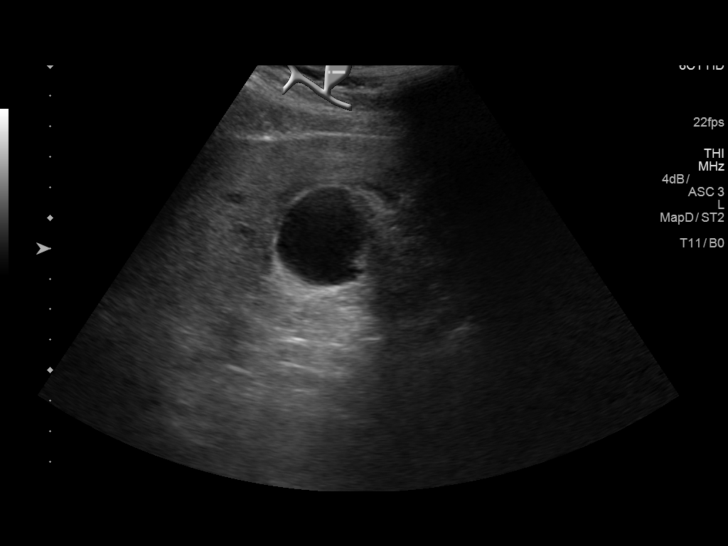
[im 14/56]
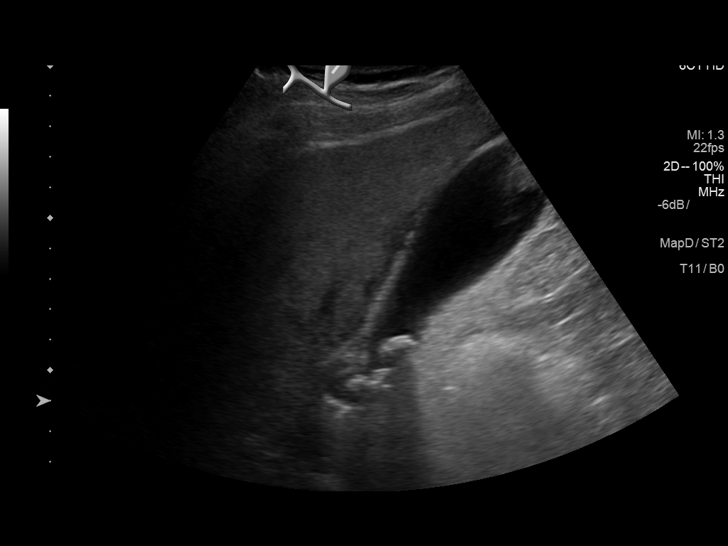
[im 19/56]
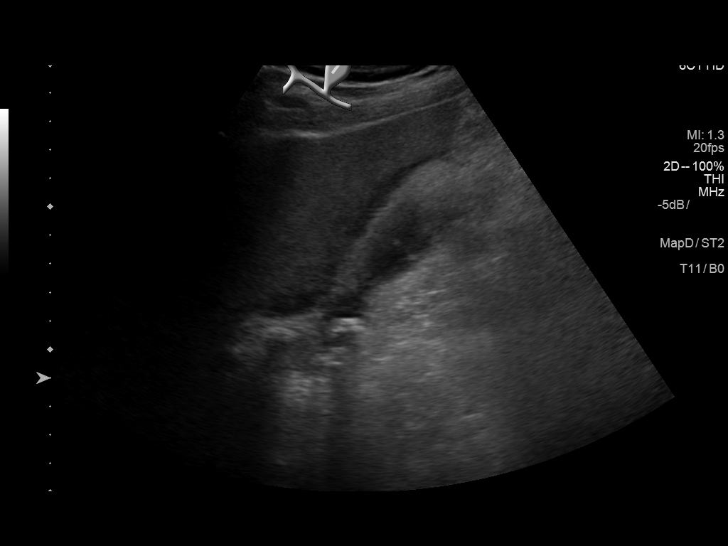
[im 21/56]
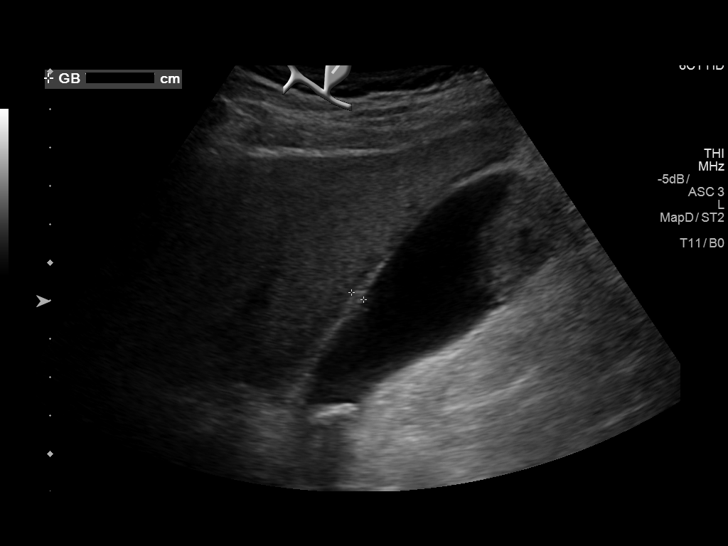
[im 26/56]
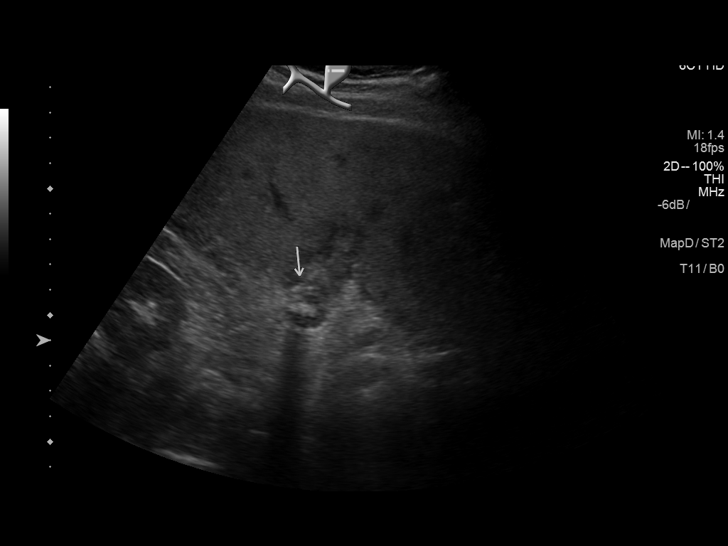
[im 30/56]
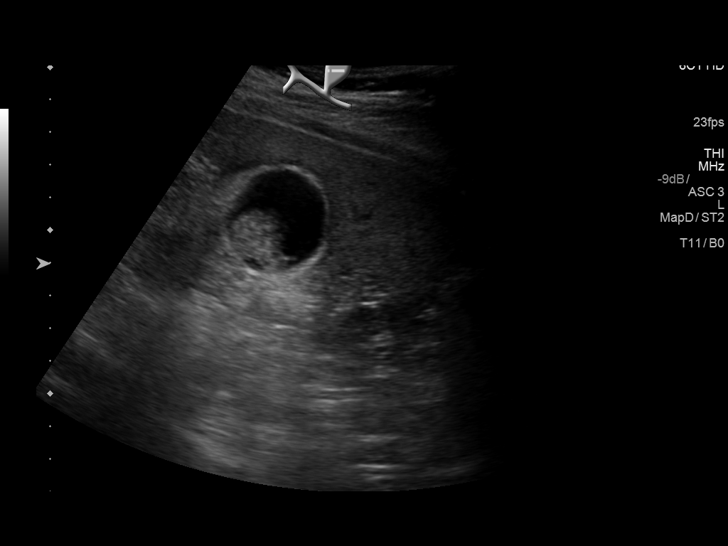
[im 35/56]
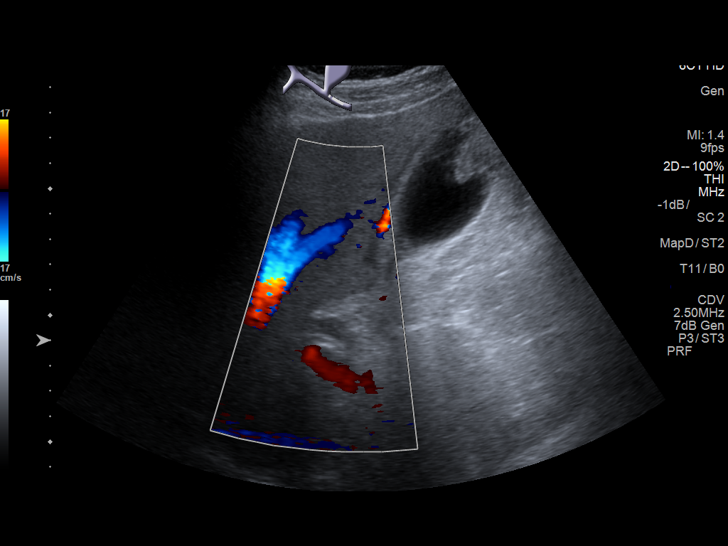
[im 37/56]
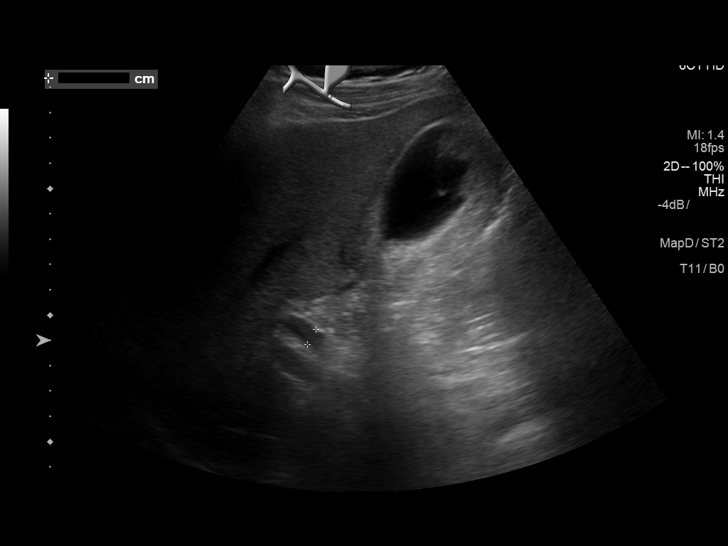
[im 42/56]
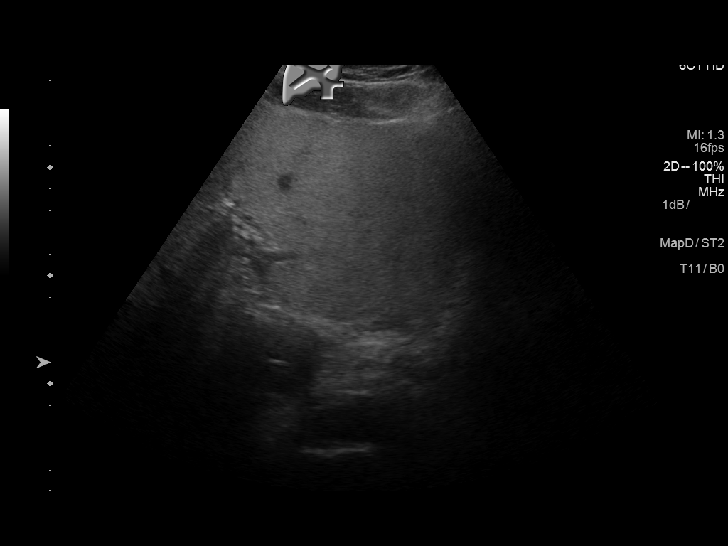
[im 46/56]
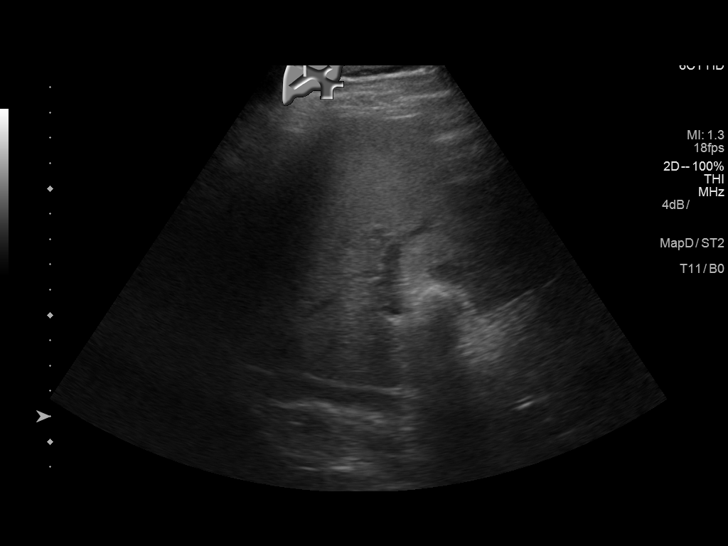
[im 51/56]
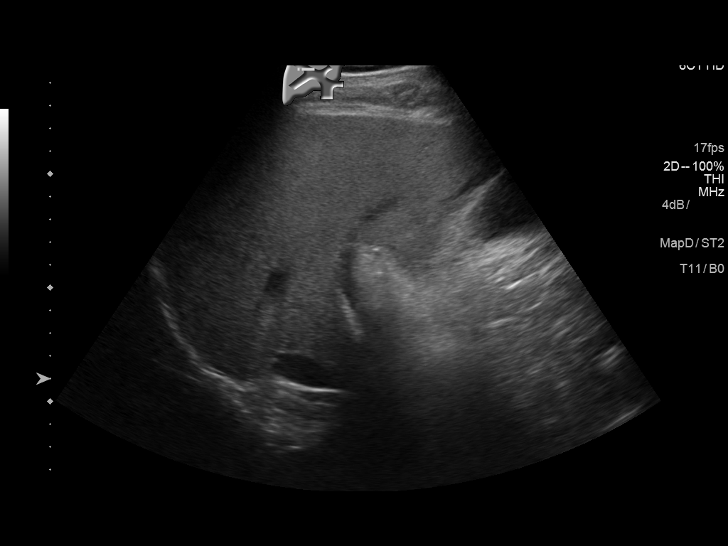
[im 56/56]
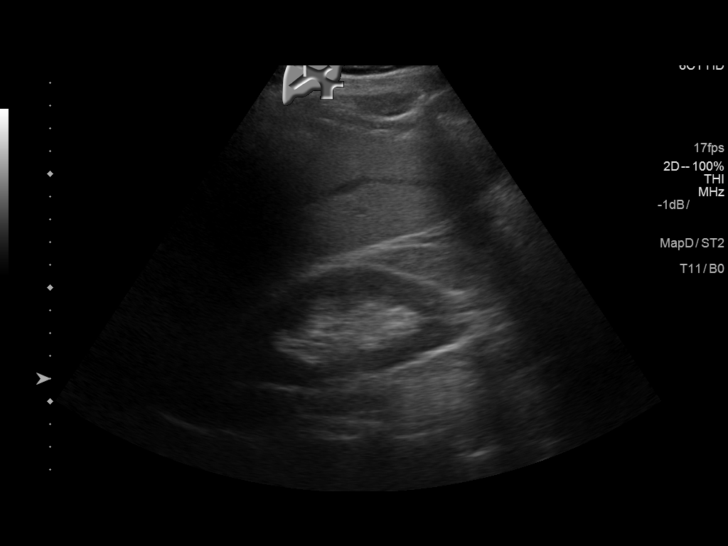

[14 of 25 positions shown; findings below may reference images not displayed]

FINDINGS: Gallbladder:

Distended containing sludge and multiple intraluminal gallstones.
There multiple stones in the gallbladder neck, largest measuring 12
mm. There is gallbladder wall thickening up to 3.7 mm with
pericholecystic fluid. A positive sonographic Murphy sign was noted
by sonographer.

Common bile duct:

Diameter: 7 mm proximally, distally not well seen.

Liver:

No focal lesion identified. Within normal limits in parenchymal
echogenicity. Normal directional flow in the main portal vein. The
IMPRESSION: 1. Acute cholecystitis.
2. Upper normal common bile duct measuring 7 mm.

## 2019-03-17 ENCOUNTER — Other Ambulatory Visit: Payer: Managed Care, Other (non HMO)

## 2019-03-21 ENCOUNTER — Other Ambulatory Visit: Payer: Managed Care, Other (non HMO)

## 2019-03-21 ENCOUNTER — Other Ambulatory Visit: Payer: Self-pay

## 2019-03-21 DIAGNOSIS — E291 Testicular hypofunction: Secondary | ICD-10-CM

## 2019-03-21 DIAGNOSIS — I471 Supraventricular tachycardia: Secondary | ICD-10-CM

## 2019-04-22 LAB — LIPID PANEL WITH LDL/HDL RATIO
Cholesterol, Total: 178 mg/dL (ref 100–199)
HDL: 27 mg/dL — ABNORMAL LOW (ref >39)
LDL Calculated: 105 mg/dL — ABNORMAL HIGH (ref 0–99)
LDl/HDL Ratio: 3.9 ratio — ABNORMAL HIGH (ref 0.0–3.6)
Triglycerides: 232 mg/dL — ABNORMAL HIGH (ref 0–149)
VLDL Cholesterol Cal: 46 mg/dL — ABNORMAL HIGH (ref 5–40)

## 2019-04-22 LAB — COMPREHENSIVE METABOLIC PANEL
ALT: 25 IU/L (ref 0–44)
AST: 25 IU/L (ref 0–40)
Albumin/Globulin Ratio: 1.7 (ref 1.2–2.2)
Albumin: 4.7 g/dL (ref 3.8–4.9)
Alkaline Phosphatase: 54 IU/L (ref 39–117)
BUN/Creatinine Ratio: 10 (ref 9–20)
BUN: 12 mg/dL (ref 6–24)
Bilirubin Total: 0.4 mg/dL (ref 0.0–1.2)
CO2: 24 mmol/L (ref 20–29)
Calcium: 9.3 mg/dL (ref 8.7–10.2)
Chloride: 95 mmol/L — ABNORMAL LOW (ref 96–106)
Creatinine, Ser: 1.25 mg/dL (ref 0.76–1.27)
GFR calc Af Amer: 74 mL/min/{1.73_m2} (ref >59)
GFR calc non Af Amer: 64 mL/min/{1.73_m2} (ref >59)
Globulin, Total: 2.8 g/dL (ref 1.5–4.5)
Glucose: 84 mg/dL (ref 65–99)
Potassium: 4.3 mmol/L (ref 3.5–5.2)
Sodium: 136 mmol/L (ref 134–144)
Total Protein: 7.5 g/dL (ref 6.0–8.5)

## 2019-04-22 LAB — TESTOSTERONE: Testosterone: 342 ng/dL (ref 264–916)

## 2019-12-19 ENCOUNTER — Other Ambulatory Visit: Payer: Managed Care, Other (non HMO)

## 2019-12-22 ENCOUNTER — Other Ambulatory Visit: Payer: Self-pay

## 2019-12-22 ENCOUNTER — Other Ambulatory Visit: Payer: Managed Care, Other (non HMO)

## 2019-12-22 DIAGNOSIS — E785 Hyperlipidemia, unspecified: Secondary | ICD-10-CM | POA: Diagnosis not present

## 2019-12-22 DIAGNOSIS — E039 Hypothyroidism, unspecified: Secondary | ICD-10-CM

## 2019-12-23 LAB — CBC WITH DIFFERENTIAL/PLATELET
Basophils Absolute: 0.1 10*3/uL (ref 0.0–0.2)
Basos: 1 %
EOS (ABSOLUTE): 0.2 10*3/uL (ref 0.0–0.4)
Eos: 3 %
Hematocrit: 45.2 % (ref 37.5–51.0)
Hemoglobin: 15.3 g/dL (ref 13.0–17.7)
Immature Grans (Abs): 0.1 10*3/uL (ref 0.0–0.1)
Immature Granulocytes: 1 %
Lymphocytes Absolute: 2.1 10*3/uL (ref 0.7–3.1)
Lymphs: 24 %
MCH: 30.9 pg (ref 26.6–33.0)
MCHC: 33.8 g/dL (ref 31.5–35.7)
MCV: 91 fL (ref 79–97)
Monocytes Absolute: 0.8 10*3/uL (ref 0.1–0.9)
Monocytes: 8 %
Neutrophils Absolute: 5.7 10*3/uL (ref 1.4–7.0)
Neutrophils: 63 %
Platelets: 231 10*3/uL (ref 150–450)
RBC: 4.95 x10E6/uL (ref 4.14–5.80)
RDW: 13.4 % (ref 11.6–15.4)
WBC: 9 10*3/uL (ref 3.4–10.8)

## 2019-12-23 LAB — COMPREHENSIVE METABOLIC PANEL
ALT: 38 IU/L (ref 0–44)
AST: 33 IU/L (ref 0–40)
Albumin/Globulin Ratio: 1.4 (ref 1.2–2.2)
Albumin: 4.2 g/dL (ref 3.8–4.9)
Alkaline Phosphatase: 74 IU/L (ref 39–117)
BUN/Creatinine Ratio: 8 — ABNORMAL LOW (ref 9–20)
BUN: 8 mg/dL (ref 6–24)
Bilirubin Total: 0.6 mg/dL (ref 0.0–1.2)
CO2: 28 mmol/L (ref 20–29)
Calcium: 9.3 mg/dL (ref 8.7–10.2)
Chloride: 95 mmol/L — ABNORMAL LOW (ref 96–106)
Creatinine, Ser: 1.05 mg/dL (ref 0.76–1.27)
GFR calc Af Amer: 92 mL/min/{1.73_m2} (ref >59)
GFR calc non Af Amer: 80 mL/min/{1.73_m2} (ref >59)
Globulin, Total: 3.1 g/dL (ref 1.5–4.5)
Glucose: 81 mg/dL (ref 65–99)
Potassium: 4.2 mmol/L (ref 3.5–5.2)
Sodium: 138 mmol/L (ref 134–144)
Total Protein: 7.3 g/dL (ref 6.0–8.5)

## 2019-12-23 LAB — LIPID PANEL
Chol/HDL Ratio: 7.7 ratio — ABNORMAL HIGH (ref 0.0–5.0)
Cholesterol, Total: 223 mg/dL — ABNORMAL HIGH (ref 100–199)
HDL: 29 mg/dL — ABNORMAL LOW (ref >39)
LDL Chol Calc (NIH): 137 mg/dL — ABNORMAL HIGH (ref 0–99)
Triglycerides: 313 mg/dL — ABNORMAL HIGH (ref 0–149)
VLDL Cholesterol Cal: 57 mg/dL — ABNORMAL HIGH (ref 5–40)

## 2019-12-23 LAB — TSH: TSH: 47.1 u[IU]/mL — ABNORMAL HIGH (ref 0.450–4.500)

## 2020-07-26 ENCOUNTER — Other Ambulatory Visit: Payer: Self-pay

## 2020-07-26 ENCOUNTER — Other Ambulatory Visit: Payer: Managed Care, Other (non HMO)

## 2020-07-26 DIAGNOSIS — I10 Essential (primary) hypertension: Secondary | ICD-10-CM

## 2020-07-26 DIAGNOSIS — E785 Hyperlipidemia, unspecified: Secondary | ICD-10-CM

## 2020-07-26 DIAGNOSIS — E039 Hypothyroidism, unspecified: Secondary | ICD-10-CM | POA: Diagnosis not present

## 2020-07-27 LAB — CBC WITH DIFFERENTIAL/PLATELET
Basophils Absolute: 0.1 10*3/uL (ref 0.0–0.2)
Basos: 1 %
EOS (ABSOLUTE): 0.2 10*3/uL (ref 0.0–0.4)
Eos: 2 %
Hematocrit: 46.9 % (ref 37.5–51.0)
Hemoglobin: 15.9 g/dL (ref 13.0–17.7)
Immature Grans (Abs): 0.1 10*3/uL (ref 0.0–0.1)
Immature Granulocytes: 1 %
Lymphocytes Absolute: 1.8 10*3/uL (ref 0.7–3.1)
Lymphs: 20 %
MCH: 30.9 pg (ref 26.6–33.0)
MCHC: 33.9 g/dL (ref 31.5–35.7)
MCV: 91 fL (ref 79–97)
Monocytes Absolute: 0.8 10*3/uL (ref 0.1–0.9)
Monocytes: 9 %
Neutrophils Absolute: 6.3 10*3/uL (ref 1.4–7.0)
Neutrophils: 67 %
Platelets: 221 10*3/uL (ref 150–450)
RBC: 5.14 x10E6/uL (ref 4.14–5.80)
RDW: 13.4 % (ref 11.6–15.4)
WBC: 9.2 10*3/uL (ref 3.4–10.8)

## 2020-07-27 LAB — COMPREHENSIVE METABOLIC PANEL
ALT: 27 IU/L (ref 0–44)
AST: 21 IU/L (ref 0–40)
Albumin/Globulin Ratio: 1.5 (ref 1.2–2.2)
Albumin: 4.6 g/dL (ref 3.8–4.9)
Alkaline Phosphatase: 66 IU/L (ref 44–121)
BUN/Creatinine Ratio: 9 (ref 9–20)
BUN: 10 mg/dL (ref 6–24)
Bilirubin Total: 0.4 mg/dL (ref 0.0–1.2)
CO2: 26 mmol/L (ref 20–29)
Calcium: 9.5 mg/dL (ref 8.7–10.2)
Chloride: 95 mmol/L — ABNORMAL LOW (ref 96–106)
Creatinine, Ser: 1.14 mg/dL (ref 0.76–1.27)
GFR calc Af Amer: 83 mL/min/{1.73_m2} (ref >59)
GFR calc non Af Amer: 71 mL/min/{1.73_m2} (ref >59)
Globulin, Total: 3.1 g/dL (ref 1.5–4.5)
Glucose: 79 mg/dL (ref 65–99)
Potassium: 4.3 mmol/L (ref 3.5–5.2)
Sodium: 138 mmol/L (ref 134–144)
Total Protein: 7.7 g/dL (ref 6.0–8.5)

## 2020-07-27 LAB — LIPID PANEL
Chol/HDL Ratio: 7.3 ratio — ABNORMAL HIGH (ref 0.0–5.0)
Cholesterol, Total: 212 mg/dL — ABNORMAL HIGH (ref 100–199)
HDL: 29 mg/dL — ABNORMAL LOW (ref >39)
LDL Chol Calc (NIH): 132 mg/dL — ABNORMAL HIGH (ref 0–99)
Triglycerides: 283 mg/dL — ABNORMAL HIGH (ref 0–149)
VLDL Cholesterol Cal: 51 mg/dL — ABNORMAL HIGH (ref 5–40)

## 2020-07-27 LAB — TSH: TSH: 16.3 u[IU]/mL — ABNORMAL HIGH (ref 0.450–4.500)

## 2020-09-03 ENCOUNTER — Other Ambulatory Visit: Payer: Self-pay

## 2020-09-03 ENCOUNTER — Encounter: Payer: Self-pay | Admitting: Surgery

## 2020-09-03 ENCOUNTER — Ambulatory Visit (INDEPENDENT_AMBULATORY_CARE_PROVIDER_SITE_OTHER): Payer: Managed Care, Other (non HMO) | Admitting: Surgery

## 2020-09-03 VITALS — BP 153/93 | HR 77 | Temp 98.3°F | Ht 71.0 in | Wt 198.4 lb

## 2020-09-03 DIAGNOSIS — R112 Nausea with vomiting, unspecified: Secondary | ICD-10-CM | POA: Diagnosis not present

## 2020-09-03 DIAGNOSIS — M6208 Separation of muscle (nontraumatic), other site: Secondary | ICD-10-CM

## 2020-09-03 NOTE — Patient Instructions (Addendum)
Dr.Piscoya discussed with patient to follow up with a Gastroenterologist. Referral was sent at today's visit to [facility]. Once their facility receives your referral, they will contact you for an appointment. Follow-up with our office as needed. Please call and ask to speak with a nurse if you develop questions or concerns.  Diastasis Recti  Diastasis recti is when the muscles of the abdomen (rectus abdominis muscles) become thin and separate. The result is a wider space between the right and left abdomen (abdominal) muscles. This wider space between the muscles may cause a bulge in the middle of your abdomen. You may notice this bulge when you are straining or when you sit up from a lying down position. Diastasis recti can affect men and women. It is most common among pregnant women, infants, people who are obese, and people who have had abdominal surgery. Exercise or surgical treatment may help correct it. What are the causes? Common causes of this condition include:  Pregnancy. The growing uterus puts pressure on the abdominal muscles, which causes the muscles to separate.  Obesity. Excess fat puts pressure on abdominal muscles.  Weightlifting.  Some abdomen exercises.  Advanced age.  Genetics.  Prior abdominal surgery. What increases the risk? This condition is more likely to develop in:  Women.  Newborns, especially newborns who are born early (prematurely). What are the signs or symptoms? Common symptoms of this condition include:  A bulge in the middle of the abdomen. You will notice it most when you sit up or strain.  Pain in the low back, pelvis, or hips.  Constipation.  Inability to control when you urinate (urinary incontinence).  Bloating.  Poor posture. How is this diagnosed? This condition is diagnosed with a physical exam. Your health care provider will ask you to lie flat on your back and do a crunch or half sit-up. If you have diastasis  recti, a vertical bulge will appear between your abdominal muscles in the center of your abdomen. Your health care provider will measure the gap between your muscles with one of the following:  A medical device used to measure the space between two objects (caliper).  A tape measure.  CT scan.  Ultrasound.  Finger spaces. Your health care provider will measure the space using their fingers. How is this treated? If your muscle separation is not too large, you may not need treatment. However, if you are a woman who plans to become pregnant again, you should treat this condition before your next pregnancy. Treatment may include:  Physical therapy to strengthen and tighten your abdominal muscles.  Lifestyle changes such as weight loss and exercise.  Over-the-counter pain medicines as needed.  Surgery to correct the separation. Follow these instructions at home: Activity  Return to your normal activities as told by your health care provider. Ask your health care provider what activities are safe for you.  When lifting weights or doing exercises using your abdominal muscles or the muscles in the center of your body that give stability (core muscles), make sure you are doing your exercises and movements correctly. Proper form can help to prevent the condition from happening again. General instructions  If you are overweight, ask your health care provider for help with weight loss. Losing even a small amount of weight can help to improve your diastasis recti.  Take over-the-counter or prescription medicines only as told by your health care provider.  Do not strain. Straining can make the separation worse. Examples of straining include: ?  Pushing hard to have a bowel movement, such as due to constipation. ? Lifting heavy objects, including children. ? Standing up and sitting down.  Take steps to prevent constipation: ? Drink enough fluid to keep your urine clear or pale yellow. ? Take  over-the-counter or prescription medicines only as directed. ? Eat foods that are high in fiber, such as fresh fruits and vegetables, whole grains, and beans. ? Limit foods that are high in fat and processed sugars, such as fried and sweet foods. Contact a health care provider if:  You notice a new bulge in your abdomen. Get help right away if:  You experience severe discomfort in your abdomen.  You develop severe abdominal pain along with nausea, vomiting, or fever. Summary  Diastasis recti is when the abdomen (abdominal) muscles become thin and separate. Your abdomen will stick out because the space between your right and left abdomen muscles has widened.  The most common symptom is a bulge in your abdomen. You will notice it most when you sit up or are straining.  This condition is diagnosed during a physical exam.  If the abdomen separation is not too big, you may choose not to have treatment. Otherwise, you may need to undergo physical therapy or surgery. This information is not intended to replace advice given to you by your health care provider. Make sure you discuss any questions you have with your health care provider. Document Revised: 09/28/2017 Document Reviewed: 12/11/2016 Elsevier Patient Education  2020 [employer].

## 2020-09-03 NOTE — Progress Notes (Signed)
09/03/2020  Reason for Visit:  Ventral hernia  Referring Provider:  Gillermo Murdoch, MD  History of Present Illness: Max Dixon is a 54 y.o. male presenting for evaluation of possible ventral hernia.  The patient had a hand-assisted laparoscopic cholecystectomy with open umbilical hernia repair with Dr. Everlene Farrier on 10/07/2016 for acute gangrenous cholecystitis.  He reports that he's having issues with epigastric abdominal pain, associated with nausea and vomiting.  He reports that he has pain with every meal, and sometimes has emesis of what he just ate.  The pain starts about 10 minutes after eating.  There is no specific food trigger and it can happen with pizza or with green beans. Denies any blood in the emesis or blood in the stool.  Denies any other areas of pain and the pain does not radiate.  He's also concerned about a bulging area in his upper abdomen and is worried this is a hernia.  He had a CT scan as outpatient outside of our system.  I am unable to see any images, but the report reads "No acute abdominal or pelvic abnormality" and there is no mention of any hernia.  Of note, he still smokes about 2 packs per day.  He has never seen a gastroenterologist and does not take any antacids.  Past Medical History: Past Medical History:  Diagnosis Date  . A-fib (HCC)   . CHF (congestive heart failure) (HCC)   . COPD (chronic obstructive pulmonary disease) (HCC)   . Hyperlipidemia   . Hypertension   . Hypothyroidism   . Smokers' cough (HCC)   . SVT (supraventricular tachycardia) (HCC)    benign     Past Surgical History: Past Surgical History:  Procedure Laterality Date  . CATARACT EXTRACTION W/PHACO Left 12/12/2017   Procedure: CATARACT EXTRACTION PHACO AND INTRAOCULAR LENS PLACEMENT (IOC) LEFT;  Surgeon: Lockie Mola, MD;  Location: [facility];  Service: Ophthalmology;  Laterality: Left;  . CATARACT EXTRACTION W/PHACO Right 01/02/2018   Procedure: CATARACT  EXTRACTION PHACO AND INTRAOCULAR LENS PLACEMENT (IOC) RIGHT;  Surgeon: Lockie Mola, MD;  Location: [facility];  Service: Ophthalmology;  Laterality: Right;  requests early  . CHOLECYSTECTOMY N/A 10/07/2016   Procedure: LAPAROSCOPIC CHOLECYSTECTOMY WITH INTRAOPERATIVE CHOLANGIOGRAM repair of umbilical hernia;  Surgeon: Leafy Ro, MD;  Location: [facility];  Service: General;  Laterality: N/A;  . ELECTROPHYSIOLOGIC STUDY N/A 09/11/2016   Procedure: SVT Ablation;  Surgeon: Will [name], MD;  Location: [facility];  Service: Cardiovascular;  Laterality: N/A;    Home Medications: Prior to Admission medications   Medication Sig Start Date End Date Taking? Authorizing Provider  fenofibrate (TRICOR) 145 MG tablet Take 145 mg by mouth every morning. 08/14/20  Yes [provider]  levothyroxine (SYNTHROID, LEVOTHROID) 175 MCG tablet Take 175 mcg by mouth every morning. 05/15/16  Yes [provider]  lisinopril-hydrochlorothiazide (PRINZIDE,ZESTORETIC) 20-12.5 MG tablet Take 2 tablets by mouth daily. am 05/15/16  Yes [provider]  Magnesium Oxide 250 MG TABS Take 250 mg by mouth daily.    Yes [provider]  rosuvastatin (CRESTOR) 20 MG tablet Take 20 mg by mouth every morning. 08/14/20  Yes [provider]  sildenafil (VIAGRA) 100 MG tablet Take by mouth daily as needed. 08/17/20  Yes [provider]  amoxicillin-clavulanate (AUGMENTIN) 875-125 MG tablet Take 1 tablet by mouth 2 (two) times daily. One po bid x 7 days Patient not taking: Reported on 09/03/2020 01/20/18   Rolan Bucco, MD  meclizine (ANTIVERT) 25 MG tablet Take 1 tablet (25 mg total) by mouth 3 (three) times daily as needed for dizziness. Patient not taking: Reported on 09/03/2020 03/25/18   Merrily Brittle, MD  metoprolol tartrate (LOPRESSOR) 100 MG tablet Take 1 tablet (100 mg total) by mouth 2 (two) times daily. Patient taking differently: Take 100 mg  by mouth daily. am 03/16/17 01/20/18  Regan Lemming, MD  traMADol (ULTRAM) 50 MG tablet Take 1 tablet (50 mg total) by mouth every 6 (six) hours as needed. Patient not taking: Reported on 09/03/2020 01/20/18   Rolan Bucco, MD    Allergies: No Known Allergies  Review of Systems: Review of Systems  Constitutional: Negative for chills and fever.  Respiratory: Negative for shortness of breath.   Cardiovascular: Negative for chest pain.  Gastrointestinal: Positive for abdominal pain, nausea and vomiting. Negative for blood in stool.    Physical Exam BP (!) 153/93   Pulse 77   Temp 98.3 F (36.8 C) (Oral)   Ht 5\' 11"  (1.803 m)   Wt 198 lb 6.4 oz (90 kg)   SpO2 93%   BMI 27.67 kg/m  CONSTITUTIONAL: No acute distress HEENT:  Normocephalic, atraumatic, extraocular motion intact. RESPIRATORY:  Normal respiratory effort without pathologic use of accessory muscles. CARDIOVASCULAR:  Regular rhythm and rate. GI: The abdomen is soft, non-distended, with some tenderness to palpation in the epigastric region.  Prior incisions are well healed without evidence of hernia.  He does have however, diastasis recti, but there is no hernia. NEUROLOGIC:  Motor and sensation is grossly normal.  Cranial nerves are grossly intact. PSYCH:  Alert and oriented to person, place and time. Affect is normal.  Labs/Imaging: CT scan abdomen and pelvis 08/23/20 IMPRESSION: -No acute abdominal or pelvic abnormality -Surgically absent gallbladder -Mild bilateral perinephric stranding. -Left seminal vesicle calcifications. -Atherosclerosis.  Assessment and Plan: This is a 54 y.o. male with epigastric pain associated with nausea and vomiting.  --Discussed with the patient that he does not have a ventral hernia, but rather diastasis recti.  Explained to him and his wife what this is, and that there is no hernia defect that would need repair.  Discussed with them that there are certain exercises that he could  do to strengthen his core to help, but no exercise will bring the muscles back together.  Furthermore, this symptoms of epigastric pain, nausea, and vomiting are not related to his diastasis, and this warrants further workup.  He is s/p cholecystectomy.  Will refer him to GI for evaluation.   --In the meantime, recommended that he do a trial of Prilosec.  Advised him that some of the possible GI pathology can be attributed to his smoking and cessation would be of utmost importance. --Follow up prn.  Face-to-face time spent with the patient and care providers was 40 minutes, with more than 50% of the time spent counseling, educating, and coordinating care of the patient.     57, MD  [facility]

## 2020-09-10 ENCOUNTER — Encounter: Payer: Self-pay | Admitting: Gastroenterology

## 2020-09-10 ENCOUNTER — Ambulatory Visit: Payer: Managed Care, Other (non HMO) | Admitting: Gastroenterology

## 2020-09-10 ENCOUNTER — Other Ambulatory Visit: Payer: Self-pay

## 2020-09-10 VITALS — BP 147/89 | HR 65 | Temp 97.8°F | Ht 71.0 in | Wt 199.1 lb

## 2020-09-10 DIAGNOSIS — G8929 Other chronic pain: Secondary | ICD-10-CM

## 2020-09-10 DIAGNOSIS — R1013 Epigastric pain: Secondary | ICD-10-CM | POA: Diagnosis not present

## 2020-09-10 NOTE — Progress Notes (Signed)
Arlyss Repress, MD [address]  [address]  [place], [place] [address]  Main: [phone]  Fax: [phone]    Gastroenterology Consultation  Referring Provider:     Sherron Monday, MD Primary Care Physician:  Sherron Monday, MD Primary Gastroenterologist:  Dr. Arlyss Repress Reason for Consultation:     Chronic epigastric pain, nausea        HPI:   Max Dixon is a 54 y.o. male referred by Dr. Sherron Monday, MD  for consultation & management of chronic epigastric pain.  Patient reports that approximately 1 year history of constant, mild to moderate in intensity, sharp-like pain in the epigastric region, radiating across the upper abdomen associated with nausea and abdominal bloating.  He reports having regular bowel movements.  He does report worsening of pain postprandial.  He denies any weight loss.  He does have a heavy smoking history, 2 packs/day, 80+ pack years, he does acknowledge drinking sodas, sweet tea regularly as well as consumes red meat regularly.  Patient is accompanied by his wife today.  Patient reports that he underwent CT of his abdomen about 2 weeks ago at [facility].  Report not available with me.  He had history of umbilical hernia repair secondary to obstruction, initially evaluated by Dr. Aleen Campi, general surgeon who felt his pain is not related to his hernia and was recommended to try Pepcid.  He has not tried yet.  Labs from 9/27 revealed normal CMP, CBC  Patient had acute cholecystitis in 09/2016, underwent cholecystectomy  Patient works in [employer]  NSAIDs: None  Antiplts/Anticoagulants/Anti thrombotics: None  GI Procedures:  None  Past Medical History:  Diagnosis Date  . A-fib (HCC)   . CHF (congestive heart failure) (HCC)   . COPD (chronic obstructive pulmonary disease) (HCC)   . Hyperlipidemia   . Hypertension   . Hypothyroidism   . Smokers' cough (HCC)   . SVT (supraventricular tachycardia) (HCC)     benign    Past Surgical History:  Procedure Laterality Date  . CATARACT EXTRACTION W/PHACO Left 12/12/2017   Procedure: CATARACT EXTRACTION PHACO AND INTRAOCULAR LENS PLACEMENT (IOC) LEFT;  Surgeon: Lockie Mola, MD;  Location: [facility];  Service: Ophthalmology;  Laterality: Left;  . CATARACT EXTRACTION W/PHACO Right 01/02/2018   Procedure: CATARACT EXTRACTION PHACO AND INTRAOCULAR LENS PLACEMENT (IOC) RIGHT;  Surgeon: Lockie Mola, MD;  Location: [facility];  Service: Ophthalmology;  Laterality: Right;  requests early  . CHOLECYSTECTOMY N/A 10/07/2016   Procedure: LAPAROSCOPIC CHOLECYSTECTOMY WITH INTRAOPERATIVE CHOLANGIOGRAM repair of umbilical hernia;  Surgeon: Leafy Ro, MD;  Location: [facility];  Service: General;  Laterality: N/A;  . ELECTROPHYSIOLOGIC STUDY N/A 09/11/2016   Procedure: SVT Ablation;  Surgeon: Will [name], MD;  Location: [facility];  Service: Cardiovascular;  Laterality: N/A;    Current Outpatient Medications:  .  fenofibrate (TRICOR) 145 MG tablet, Take 145 mg by mouth every morning., Disp: , Rfl:  .  levothyroxine (SYNTHROID, LEVOTHROID) 175 MCG tablet, Take 175 mcg by mouth every morning., Disp: , Rfl: 0 .  lisinopril-hydrochlorothiazide (PRINZIDE,ZESTORETIC) 20-12.5 MG tablet, Take 2 tablets by mouth daily. am, Disp: , Rfl: 0 .  Magnesium Oxide 250 MG TABS, Take 250 mg by mouth daily. , Disp: , Rfl:  .  metoprolol tartrate (LOPRESSOR) 100 MG tablet, Take 1 tablet (100 mg total) by mouth 2 (two) times daily. (Patient taking differently: Take 100 mg by mouth daily. am), Disp: 180 tablet,  Rfl: 0 .  rosuvastatin (CRESTOR) 20 MG tablet, Take 20 mg by mouth every morning., Disp: , Rfl:  .  sildenafil (VIAGRA) 100 MG tablet, Take by mouth daily as needed., Disp: , Rfl:  .  amoxicillin-clavulanate (AUGMENTIN) 875-125 MG tablet, Take 1 tablet by mouth 2 (two) times daily. One po bid x 7 days (Patient not taking: Reported on  09/03/2020), Disp: 14 tablet, Rfl: 0 .  meclizine (ANTIVERT) 25 MG tablet, Take 1 tablet (25 mg total) by mouth 3 (three) times daily as needed for dizziness. (Patient not taking: Reported on 09/03/2020), Disp: 30 tablet, Rfl: 0 .  traMADol (ULTRAM) 50 MG tablet, Take 1 tablet (50 mg total) by mouth every 6 (six) hours as needed. (Patient not taking: Reported on 09/03/2020), Disp: 15 tablet, Rfl: 0   Family History  Problem Relation Age of Onset  . Hypertension Father      Social History   Tobacco Use  . Smoking status: Current Every Day Smoker    Packs/day: 2.00    Years: 43.00    Pack years: 86.00    Types: Cigarettes  . Smokeless tobacco: Never Used  . Tobacco comment: since age 22  Vaping Use  . Vaping Use: Never used  Substance Use Topics  . Alcohol use: No  . Drug use: No    Allergies as of 09/10/2020  . (No Known Allergies)    Review of Systems:    All systems reviewed and negative except where noted in HPI.   Physical Exam:  BP (!) 147/89 (BP Location: Left Arm, Patient Position: Sitting, Cuff Size: Normal)   Pulse 65   Temp 97.8 F (36.6 C) (Oral)   Ht 5\' 11"  (1.803 m)   Wt 199 lb 2 oz (90.3 kg)   BMI 27.77 kg/m  No LMP for male patient.  General:   Alert,  Well-developed, well-nourished, pleasant and cooperative in NAD Head:  Normocephalic and atraumatic. Eyes:  Sclera clear, no icterus.   Conjunctiva pink. Ears:  Normal auditory acuity. Nose:  No deformity, discharge, or lesions. Mouth:  No deformity or lesions,oropharynx pink & moist. Neck:  Supple; no masses or thyromegaly. Lungs:  Respirations even and unlabored.  Clear throughout to auscultation.   No wheezes, crackles, or rhonchi. No acute distress. Heart:  Regular rate and rhythm; no murmurs, clicks, rubs, or gallops. Abdomen:  Normal bowel sounds. Soft, mild epigastric tenderness and moderately distended, tympanic to percussion without masses, hepatosplenomegaly or hernias noted.  No guarding or  rebound tenderness.   Rectal: Not performed Msk:  Symmetrical without gross deformities. Good, equal movement & strength bilaterally. Pulses:  Normal pulses noted. Extremities:  No clubbing or edema.  No cyanosis. Neurologic:  Alert and oriented x3;  grossly normal neurologically. Skin:  Intact without significant lesions or rashes. No jaundice. Lymph Nodes:  No significant cervical adenopathy. Psych:  Alert and cooperative. Normal mood and affect.  Imaging Studies: No recent imaging  Assessment and Plan:   KASEM MOZER is a 54 y.o. Caucasian male with chronic tobacco use, history of A. fib, hypertension, hyperlipidemia, hypothyroidism, s/p cholecystectomy secondary to acute cholecystitis in 2017 is seen in consultation for 1 year history of chronic epigastric pain associated with nausea and abdominal bloating  Recommend upper endoscopy for further evaluation Perform H. pylori breath test Obtain copy of most recent CT abdomen and pelvis report from [facility] Highly advised patient to cut back on smoking  Colon cancer screening: Patient is overdue and reiterated regarding  colonoscopy.  Patient is not willing to undergo colonoscopy because he does not want anybody to see his rear end.  His wife also mentioned that Cologuard was about $600 and he did not undergo   Follow up in 2 to 3 months   Arlyss Repress, MD

## 2020-09-27 ENCOUNTER — Encounter: Payer: Self-pay | Admitting: Internal Medicine

## 2020-10-18 ENCOUNTER — Other Ambulatory Visit: Payer: Managed Care, Other (non HMO) | Attending: Gastroenterology

## 2020-10-20 ENCOUNTER — Encounter: Admission: RE | Payer: Self-pay | Source: Home / Self Care

## 2020-10-20 ENCOUNTER — Ambulatory Visit
Admission: RE | Admit: 2020-10-20 | Payer: Managed Care, Other (non HMO) | Source: Home / Self Care | Admitting: Gastroenterology

## 2020-10-20 SURGERY — ESOPHAGOGASTRODUODENOSCOPY (EGD) WITH PROPOFOL
Anesthesia: General

## 2020-11-05 ENCOUNTER — Ambulatory Visit: Payer: Self-pay | Admitting: Gastroenterology

## 2021-02-21 ENCOUNTER — Other Ambulatory Visit: Payer: Managed Care, Other (non HMO)

## 2021-02-21 DIAGNOSIS — E785 Hyperlipidemia, unspecified: Secondary | ICD-10-CM

## 2021-02-21 DIAGNOSIS — E039 Hypothyroidism, unspecified: Secondary | ICD-10-CM

## 2021-02-22 LAB — COMPREHENSIVE METABOLIC PANEL
ALT: 21 IU/L (ref 0–44)
AST: 18 IU/L (ref 0–40)
Albumin/Globulin Ratio: 1.8 (ref 1.2–2.2)
Albumin: 4.5 g/dL (ref 3.8–4.9)
Alkaline Phosphatase: 51 IU/L (ref 44–121)
BUN/Creatinine Ratio: 9 (ref 9–20)
BUN: 12 mg/dL (ref 6–24)
Bilirubin Total: 0.5 mg/dL (ref 0.0–1.2)
CO2: 24 mmol/L (ref 20–29)
Calcium: 9.1 mg/dL (ref 8.7–10.2)
Chloride: 92 mmol/L — ABNORMAL LOW (ref 96–106)
Creatinine, Ser: 1.33 mg/dL — ABNORMAL HIGH (ref 0.76–1.27)
Globulin, Total: 2.5 g/dL (ref 1.5–4.5)
Glucose: 118 mg/dL — ABNORMAL HIGH (ref 65–99)
Potassium: 4.8 mmol/L (ref 3.5–5.2)
Sodium: 134 mmol/L (ref 134–144)
Total Protein: 7 g/dL (ref 6.0–8.5)
eGFR: 63 mL/min/{1.73_m2} (ref >59)

## 2021-02-22 LAB — CBC WITH DIFFERENTIAL/PLATELET
Basophils Absolute: 0.1 10*3/uL (ref 0.0–0.2)
Basos: 1 %
EOS (ABSOLUTE): 0.5 10*3/uL — ABNORMAL HIGH (ref 0.0–0.4)
Eos: 5 %
Hematocrit: 45.9 % (ref 37.5–51.0)
Hemoglobin: 15.4 g/dL (ref 13.0–17.7)
Immature Grans (Abs): 0.1 10*3/uL (ref 0.0–0.1)
Immature Granulocytes: 1 %
Lymphocytes Absolute: 1.9 10*3/uL (ref 0.7–3.1)
Lymphs: 19 %
MCH: 30.7 pg (ref 26.6–33.0)
MCHC: 33.6 g/dL (ref 31.5–35.7)
MCV: 91 fL (ref 79–97)
Monocytes Absolute: 0.7 10*3/uL (ref 0.1–0.9)
Monocytes: 7 %
Neutrophils Absolute: 6.6 10*3/uL (ref 1.4–7.0)
Neutrophils: 67 %
Platelets: 208 10*3/uL (ref 150–450)
RBC: 5.02 x10E6/uL (ref 4.14–5.80)
RDW: 12.7 % (ref 11.6–15.4)
WBC: 9.9 10*3/uL (ref 3.4–10.8)

## 2021-02-22 LAB — LIPID PANEL
Chol/HDL Ratio: 6.6 ratio — ABNORMAL HIGH (ref 0.0–5.0)
Cholesterol, Total: 158 mg/dL (ref 100–199)
HDL: 24 mg/dL — ABNORMAL LOW (ref >39)
LDL Chol Calc (NIH): 96 mg/dL (ref 0–99)
Triglycerides: 221 mg/dL — ABNORMAL HIGH (ref 0–149)
VLDL Cholesterol Cal: 38 mg/dL (ref 5–40)

## 2021-02-22 LAB — TSH: TSH: 6.82 u[IU]/mL — ABNORMAL HIGH (ref 0.450–4.500)

## 2021-04-06 ENCOUNTER — Other Ambulatory Visit: Payer: Managed Care, Other (non HMO)

## 2021-04-07 ENCOUNTER — Other Ambulatory Visit: Payer: Managed Care, Other (non HMO)

## 2021-04-07 DIAGNOSIS — E039 Hypothyroidism, unspecified: Secondary | ICD-10-CM

## 2021-04-07 NOTE — Addendum Note (Signed)
Addended by: Fabio Bering D on: 04/07/2021 10:48 AM   Modules accepted: Orders

## 2021-04-13 LAB — LYME DISEASE SEROLOGY W/REFLEX: Lyme Total Antibody EIA: NEGATIVE

## 2021-04-13 LAB — TSH: TSH: 1.24 u[IU]/mL (ref 0.450–4.500)

## 2021-07-15 ENCOUNTER — Emergency Department: Payer: Managed Care, Other (non HMO)

## 2021-07-15 ENCOUNTER — Emergency Department
Admission: EM | Admit: 2021-07-15 | Discharge: 2021-07-16 | Disposition: A | Discharge status: Home / Self Care | Payer: Managed Care, Other (non HMO) | Attending: Emergency Medicine | Admitting: Emergency Medicine

## 2021-07-15 ENCOUNTER — Other Ambulatory Visit: Payer: Self-pay

## 2021-07-15 DIAGNOSIS — R202 Paresthesia of skin: Secondary | ICD-10-CM | POA: Diagnosis not present

## 2021-07-15 DIAGNOSIS — Z5321 Procedure and treatment not carried out due to patient leaving prior to being seen by health care provider: Secondary | ICD-10-CM | POA: Diagnosis not present

## 2021-07-15 DIAGNOSIS — R791 Abnormal coagulation profile: Secondary | ICD-10-CM | POA: Diagnosis not present

## 2021-07-15 LAB — DIFFERENTIAL
Abs Immature Granulocytes: 0.07 10*3/uL (ref 0.00–0.07)
Basophils Absolute: 0.1 10*3/uL (ref 0.0–0.1)
Basophils Relative: 1 %
Eosinophils Absolute: 0.3 10*3/uL (ref 0.0–0.5)
Eosinophils Relative: 3 %
Immature Granulocytes: 1 %
Lymphocytes Relative: 22 %
Lymphs Abs: 2.2 10*3/uL (ref 0.7–4.0)
Monocytes Absolute: 0.6 10*3/uL (ref 0.1–1.0)
Monocytes Relative: 6 %
Neutro Abs: 7 10*3/uL (ref 1.7–7.7)
Neutrophils Relative %: 67 %

## 2021-07-15 LAB — COMPREHENSIVE METABOLIC PANEL
ALT: 22 U/L (ref 0–44)
AST: 26 U/L (ref 15–41)
Albumin: 4.4 g/dL (ref 3.5–5.0)
Alkaline Phosphatase: 45 U/L (ref 38–126)
Anion gap: 15 (ref 5–15)
BUN: 15 mg/dL (ref 6–20)
CO2: 27 mmol/L (ref 22–32)
Calcium: 9.1 mg/dL (ref 8.9–10.3)
Chloride: 91 mmol/L — ABNORMAL LOW (ref 98–111)
Creatinine, Ser: 1.1 mg/dL (ref 0.61–1.24)
GFR, Estimated: >60 mL/min (ref >60)
Glucose, Bld: 125 mg/dL — ABNORMAL HIGH (ref 70–99)
Potassium: 3.5 mmol/L (ref 3.5–5.1)
Sodium: 133 mmol/L — ABNORMAL LOW (ref 135–145)
Total Bilirubin: 0.9 mg/dL (ref 0.3–1.2)
Total Protein: 8 g/dL (ref 6.5–8.1)

## 2021-07-15 LAB — APTT: aPTT: 28 seconds (ref 24–36)

## 2021-07-15 LAB — CBC
HCT: 45.7 % (ref 39.0–52.0)
Hemoglobin: 16.1 g/dL (ref 13.0–17.0)
MCH: 31.9 pg (ref 26.0–34.0)
MCHC: 35.2 g/dL (ref 30.0–36.0)
MCV: 90.7 fL (ref 80.0–100.0)
Platelets: 225 10*3/uL (ref 150–400)
RBC: 5.04 MIL/uL (ref 4.22–5.81)
RDW: 12.9 % (ref 11.5–15.5)
WBC: 10.3 10*3/uL (ref 4.0–10.5)
nRBC: 0 % (ref 0.0–0.2)

## 2021-07-15 LAB — PROTIME-INR
INR: 0.9 (ref 0.8–1.2)
Prothrombin Time: 12.6 seconds (ref 11.4–15.2)

## 2021-07-15 NOTE — ED Triage Notes (Signed)
Per wife pt driving bobcat: blank stare, couldn't focus or control bobcat, L side numb, on the drive to get here symptoms resolved. Hx same with similar symptoms last time was 56mo ago, never has had a stroke workup, has cardiology appt mon. Pt currently denies symptoms, nih 0

## 2021-07-18 ENCOUNTER — Encounter (INDEPENDENT_AMBULATORY_CARE_PROVIDER_SITE_OTHER): Payer: Self-pay

## 2021-07-18 ENCOUNTER — Ambulatory Visit: Payer: Managed Care, Other (non HMO)

## 2021-07-18 ENCOUNTER — Encounter: Payer: Self-pay | Admitting: *Deleted

## 2021-07-18 ENCOUNTER — Encounter: Payer: Self-pay | Admitting: Cardiology

## 2021-07-18 ENCOUNTER — Ambulatory Visit: Payer: Managed Care, Other (non HMO) | Admitting: Cardiology

## 2021-07-18 ENCOUNTER — Other Ambulatory Visit: Payer: Self-pay

## 2021-07-18 VITALS — BP 116/64 | HR 80 | Ht 71.0 in | Wt 191.8 lb

## 2021-07-18 DIAGNOSIS — R930 Abnormal findings on diagnostic imaging of skull and head, not elsewhere classified: Secondary | ICD-10-CM

## 2021-07-18 DIAGNOSIS — R55 Syncope and collapse: Secondary | ICD-10-CM

## 2021-07-18 DIAGNOSIS — I471 Supraventricular tachycardia: Secondary | ICD-10-CM

## 2021-07-18 NOTE — Patient Instructions (Signed)
Medication Instructions:  Your physician recommends that you continue on your current medications as directed. Please refer to the Current Medication list given to you today.  *If you need a refill on your cardiac medications before your next appointment, please call your pharmacy*   Lab Work: None ordered   Testing/Procedures: Your physician has recommended that you wear a 2 week holter monitor. Holter monitors are medical devices that record the heart's electrical activity. Doctors most often use these monitors to diagnose arrhythmias. Arrhythmias are problems with the speed or rhythm of the heartbeat. The monitor is a small, portable device. You can wear one while you do your normal daily activities. This is usually used to diagnose what is causing palpitations/syncope (passing out).   Follow-Up: At [facility], you and your health needs are our priority.  As part of our continuing mission to provide you with exceptional heart care, we have created designated Provider Care Teams.  These Care Teams include your primary Cardiologist (physician) and Advanced Practice Providers (APPs -  Physician Assistants and Nurse Practitioners) who all work together to provide you with the care you need, when you need it.  We recommend signing up for the patient portal called "MyChart".  Sign up information is provided on this After Visit Summary.  MyChart is used to connect with patients for Virtual Visits (Telemedicine).  Patients are able to view lab/test results, encounter notes, upcoming appointments, etc.  Non-urgent messages can be sent to your provider as well.   To learn more about what you can do with MyChart, go to [email or web address].    Your next appointment:   3 month(s)  The format for your next appointment:   In Person  Provider:   Loman Brooklyn, MD   You have been referred to neurology   Thank you for choosing [facility]!!   Dory Horn, RN [phone]   Other  Instructions                            ZIO XT- Long Term Monitor Instructions  Your physician has requested you wear a ZIO patch monitor for 14 days.  This is a single patch monitor. [employer] supplies one patch monitor per enrollment. Additional stickers are not available. Please do not apply patch if you will be having a Nuclear Stress Test,  Echocardiogram, Cardiac CT, MRI, or Chest Xray during the period you would be wearing the  monitor. The patch cannot be worn during these tests. You cannot remove and re-apply the  ZIO XT patch monitor.  Your ZIO patch monitor will be mailed 3 day USPS to your address on file. It may take 3-5 days  to receive your monitor after you have been enrolled.  Once you have received your monitor, please review the enclosed instructions. Your monitor  has already been registered assigning a specific monitor serial # to you.  Billing and Patient Assistance Program Information  We have supplied [employer] with any of your insurance information on file for billing purposes. [employer] offers a sliding scale Patient Assistance Program for patients that do not have  insurance, or whose insurance does not completely cover the cost of the ZIO monitor.  You must apply for the Patient Assistance Program to qualify for this discounted rate.  To apply, please call [employer] at [phone], select option 4, select option 2, ask to apply for  Patient Assistance Program. Meredeth Ide will ask your household income, and how many  people  are in your household. They will quote your out-of-pocket cost based on that information.  [employer] will also be able to set up a 32-month, interest-free payment plan if needed.  Applying the monitor   Shave hair from upper left chest.  Hold abrader disc by orange tab. Rub abrader in 40 strokes over the upper left chest as  indicated in your monitor instructions.  Clean area with 4 enclosed alcohol pads. Let dry.  Apply patch as indicated in  monitor instructions. Patch will be placed under collarbone on left  side of chest with arrow pointing upward.  Rub patch adhesive wings for 2 minutes. Remove white label marked "1". Remove the white  label marked "2". Rub patch adhesive wings for 2 additional minutes.  While looking in a mirror, press and release button in center of patch. A small green light will  flash 3-4 times. This will be your only indicator that the monitor has been turned on.  Do not shower for the first 24 hours. You may shower after the first 24 hours.  Press the button if you feel a symptom. You will hear a small click. Record Date, Time and  Symptom in the Patient Logbook.  When you are ready to remove the patch, follow instructions on the last 2 pages of Patient  Logbook. Stick patch monitor onto the last page of Patient Logbook.  Place Patient Logbook in the blue and white box. Use locking tab on box and tape box closed  securely. The blue and white box has prepaid postage on it. Please place it in the mailbox as  soon as possible. Your physician should have your test results approximately 7 days after the  monitor has been mailed back to [facility].  Call [facility] Customer Care at [phone] if you have questions regarding  your ZIO XT patch monitor. Call them immediately if you see an orange light blinking on your  monitor.  If your monitor falls off in less than 4 days, contact our Monitor department at [phone].  If your monitor becomes loose or falls off after 4 days call [employer] at [phone] for  suggestions on securing your monitor

## 2021-07-18 NOTE — Progress Notes (Signed)
Electrophysiology Office Note   Date:  07/18/2021   ID:  Max Dixon, DOB 14-Aug-1966, MRN [id number]  PCP:  Sherron Monday, MD  Primary Electrophysiologist:  Regan Lemming, MD    No chief complaint on file.    History of Present Illness: Max Dixon is a 55 y.o. male who presents today for electrophysiology evaluation.     He presented to the emergency room with palpitations.  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 He continued to have episodic palpitations after his ablation and was noted to have both sinus rhythm and sinus tachycardia associated with his palpitations.  Today, denies symptoms of palpitations, chest pain, shortness of breath, orthopnea, PND, lower extremity edema, claudication, dizziness, presyncope, syncope, bleeding, or neurologic sequela. The patient is tolerating medications without difficulties.  He had an episode on 07/15/2021 where he was driving a bobcat, loading bales of hay.  His wife states that he had a blank stare and was minimally responsive.  His family got him into a car to drive him to the emergency room.  After about 10 minutes, he noted some left-sided numbness, though this had apparently resolved once he got to the emergency room.  He had an ECG performed as well as a head CT.  Head CT showed no acute changes, though it did show an area of possible ischemia or infarct.  ECG showed sinus rhythm with an incomplete right bundle branch block.  He has these episodes approximately once a month.  They are all similar to this prior episode.  They generally occur when he is out working in the heat.  As there was nothing acute at the time and they had to wait for approximately 8 hours in the emergency room, they did not see a physician and went home.   Past Medical History:  Diagnosis Date   A-fib [facility])    Acute cholecystitis 10/06/2016   CHF (congestive heart failure) (HCC)    COPD (chronic obstructive  pulmonary disease) (HCC)    Hyperlipidemia    Hypertension    Hypothyroidism    Smokers' cough (HCC)    SVT (supraventricular tachycardia) (HCC)    benign   Umbilical hernia with obstruction, without gangrene    Past Surgical History:  Procedure Laterality Date   CATARACT EXTRACTION W/PHACO Left 12/12/2017   Procedure: CATARACT EXTRACTION PHACO AND INTRAOCULAR LENS PLACEMENT (IOC) LEFT;  Surgeon: Lockie Mola, MD;  Location: [facility];  Service: Ophthalmology;  Laterality: Left;   CATARACT EXTRACTION W/PHACO Right 01/02/2018   Procedure: CATARACT EXTRACTION PHACO AND INTRAOCULAR LENS PLACEMENT (IOC) RIGHT;  Surgeon: Lockie Mola, MD;  Location: [facility];  Service: Ophthalmology;  Laterality: Right;  requests early   CHOLECYSTECTOMY N/A 10/07/2016   Procedure: LAPAROSCOPIC CHOLECYSTECTOMY WITH INTRAOPERATIVE CHOLANGIOGRAM repair of umbilical hernia;  Surgeon: Leafy Ro, MD;  Location: [facility] ORS;  Service: General;  Laterality: N/A;   ELECTROPHYSIOLOGIC STUDY N/A 09/11/2016   Procedure: SVT Ablation;  Surgeon: Shonta Bourque [name], MD;  Location: [facility] INVASIVE CV LAB;  Service: Cardiovascular;  Laterality: N/A;     Current Outpatient Medications  Medication Sig Dispense Refill   fenofibrate (TRICOR) 145 MG tablet Take 145 mg by mouth every morning.     levothyroxine (SYNTHROID, LEVOTHROID) 175 MCG tablet Take 175 mcg by mouth every morning.  0   Levothyroxine Sodium (LEVOTHROID PO) Take 0.025 mcg by mouth.     lisinopril-hydrochlorothiazide (PRINZIDE,ZESTORETIC) 20-12.5 MG tablet Take 2  tablets by mouth daily. am  0   Magnesium Oxide 250 MG TABS Take 250 mg by mouth daily.      metoprolol tartrate (LOPRESSOR) 100 MG tablet Take 1 tablet (100 mg total) by mouth 2 (two) times daily. (Patient taking differently: Take 100 mg by mouth daily. am) 180 tablet 0   rosuvastatin (CRESTOR) 20 MG tablet Take 20 mg by mouth every morning.     sildenafil (VIAGRA) 100 MG  tablet Take by mouth daily as needed.     amoxicillin-clavulanate (AUGMENTIN) 875-125 MG tablet Take 1 tablet by mouth 2 (two) times daily. One po bid x 7 days (Patient not taking: Reported on 07/18/2021) 14 tablet 0   meclizine (ANTIVERT) 25 MG tablet Take 1 tablet (25 mg total) by mouth 3 (three) times daily as needed for dizziness. (Patient not taking: Reported on 07/18/2021) 30 tablet 0   traMADol (ULTRAM) 50 MG tablet Take 1 tablet (50 mg total) by mouth every 6 (six) hours as needed. (Patient not taking: Reported on 07/18/2021) 15 tablet 0   No current facility-administered medications for this visit.    Allergies:   Patient has no known allergies.   Social History:  The patient  reports that he has been smoking cigarettes. He has a 86.00 pack-year smoking history. He has never used smokeless tobacco. He reports that he does not drink alcohol and does not use drugs.   Family History:  The patient's family history includes Hypertension in his father.   ROS:  Please see the history of present illness.   Otherwise, review of systems is positive for none.   All other systems are reviewed and negative.   PHYSICAL EXAM: VS:  BP 116/64   Pulse 80   Ht 5\' 11"  (1.803 m)   Wt 191 lb 12.8 oz (87 kg)   SpO2 94%   BMI 26.75 kg/m  , BMI Body mass index is 26.75 kg/m. GEN: Well nourished, well developed, in no acute distress  HEENT: normal  Neck: no JVD, carotid bruits, or masses Cardiac: RRR; no murmurs, rubs, or gallops,no edema  Respiratory:  clear to auscultation bilaterally, normal work of breathing GI: soft, nontender, nondistended, + BS MS: no deformity or atrophy  Skin: warm and dry Neuro:  Strength and sensation are intact Psych: euthymic mood, full affect  EKG:  EKG is not ordered today. Personal review of the ekg ordered 07/18/21 shows sinus rhythm  Recent Labs: 04/07/2021: TSH 1.240 07/15/2021: ALT 22; BUN 15; Creatinine, Ser 1.10; Hemoglobin 16.1; Platelets 225; Potassium 3.5;  Sodium 133    Lipid Panel     Component Value Date/Time   CHOL 158 02/21/2021 1030   TRIG 221 (H) 02/21/2021 1030   HDL 24 (L) 02/21/2021 1030   CHOLHDL 6.6 (H) 02/21/2021 1030   LDLCALC 96 02/21/2021 1030     Wt Readings from Last 3 Encounters:  07/18/21 191 lb 12.8 oz (87 kg)  07/15/21 205 lb (93 kg)  09/10/20 199 lb 2 oz (90.3 kg)      Other studies Reviewed: Additional studies/ records that were reviewed today include: TTE 06/02/16  Review of the above records today demonstrates:  Ejection fraction 67%, grade 3 diastolic dysfunction, heart rate 170+, left atrium 42 mm mild mitral regurgitation and mild tricuspid regurgitation   ASSESSMENT AND PLAN:  1.  AVNRT: Status post ablation 09/11/2016.  No further episodes.  2.  Possible syncope: It is unclear to me as to whether or not he had syncope  or if this is something else.  Due to that, we Bilan Tedesco have him wear a 2-week monitor.  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  We Kahlia Lagunes have him follow-up with neurology.  He may need a Linq monitor in the future.  3.  Hyperlipidemia: Continue Crestor 20 mg daily.  4.  Tobacco abuse: Smokes approximately a pack and 1/2 to 2 packs/day.  We did discuss smoking cessation.  He says he is working on it, though I feel that he is precontemplative.  I did discuss his case with Dr. Pearlean Brownie who Demitri Kucinski see him in follow-up.  Current medicines are reviewed at length with the patient today.   The patient does not have concerns regarding his medicines.  The following changes were made today:  none  Labs/ tests ordered today include:  Orders Placed This Encounter  Procedures   Ambulatory referral to Neurology   LONG TERM MONITOR-LIVE TELEMETRY (3-14 DAYS)      Disposition:   FU with Abigail Marsiglia 3 months  Signed, Mykle Pascua [name], MD  07/18/2021 10:02 AM     [facility] [address] [place] [address] [phone] (office) [phone] (fax)

## 2021-07-18 NOTE — Progress Notes (Unsigned)
Patient enrolled for [employer] to mail a 14 day ZIO AT monitor to his home.  Letter with instructions mailed to patient. 

## 2021-07-19 ENCOUNTER — Telehealth: Payer: Self-pay | Admitting: Cardiology

## 2021-07-19 NOTE — Telephone Encounter (Signed)
Melissa from [facility] requested it be documented that they have attempted to contact patient and was unsuccessful, but did leave a detailed message on his voicemail.

## 2021-08-16 NOTE — Telephone Encounter (Signed)
Received notification 08/15/21 from [place] by email.  ZIO AT monitor was returned to [facility] unused.  They have cancelled his enrollment.

## 2021-10-10 ENCOUNTER — Other Ambulatory Visit: Payer: Self-pay | Admitting: *Deleted

## 2021-10-10 ENCOUNTER — Encounter: Payer: Self-pay | Admitting: Neurology

## 2021-10-10 ENCOUNTER — Telehealth: Payer: Self-pay | Admitting: Neurology

## 2021-10-10 ENCOUNTER — Ambulatory Visit: Payer: Managed Care, Other (non HMO) | Admitting: Neurology

## 2021-10-10 VITALS — BP 159/86 | HR 60 | Ht 71.0 in | Wt 193.0 lb

## 2021-10-10 DIAGNOSIS — R404 Transient alteration of awareness: Secondary | ICD-10-CM | POA: Diagnosis not present

## 2021-10-10 DIAGNOSIS — R9089 Other abnormal findings on diagnostic imaging of central nervous system: Secondary | ICD-10-CM

## 2021-10-10 DIAGNOSIS — R569 Unspecified convulsions: Secondary | ICD-10-CM

## 2021-10-10 DIAGNOSIS — T1590XS Foreign body on external eye, part unspecified, unspecified eye, sequela: Secondary | ICD-10-CM

## 2021-10-10 MED ORDER — LEVETIRACETAM 500 MG PO TABS: 500.0000 mg | ORAL_TABLET | Freq: Two times a day (BID) | ORAL | 11 refills | Status: DC

## 2021-10-10 NOTE — Telephone Encounter (Signed)
Max Dixon requires an authorization, when you get a chance please finish your notes. Thank you.

## 2021-10-10 NOTE — Patient Instructions (Signed)
I had a long discussion with the patient and his wife regarding his recurrent transient episodes of brief altered consciousness which sound like complex partial seizures and also discussed results of his abnormal CT scan and need for further evaluation.  I recommend a trial of Keppra 500 mg twice daily for seizure prophylaxis patient was advised not to drive 6 months as per [facility].  He was advised to avoid seizure provoking stimuli like sleep deprivation, extremes of activity, alcohol and stimulants usage and abrupt medication discontinuation.  Check MRI scan of the brain with and without contrast for further evaluation of the right temporal lesion.  Return for follow-up in the future in 3 months or call earlier if necessary. Seizure, Adult A seizure is a sudden burst of abnormal electrical and chemical activity in the brain. Seizures usually last from 30 seconds to 2 minutes. The abnormal activity temporarily interrupts normal brain function. Many types of seizures can affect adults. A seizure can cause many different symptoms depending on where in the brain it starts. What are the causes? Common causes of this condition include: Fever or infection. Brain injury, head trauma, bleeding in the brain, or a brain tumor. Low levels of blood sugar or salt (sodium). Kidney problems or liver problems. Metabolic disorders or other conditions that are passed from parent to child (are inherited). Reaction to a substance, such as a drug or a medicine, or suddenly stopping the use of a substance (withdrawal). A stroke. Developmental disorders such as autism spectrum disorder or cerebral palsy. In some cases, the cause of a seizure may not be known. Some people who have a seizure never have another one. A person who has repeated seizures over time without a clear cause has a condition called epilepsy. What increases the risk? You are more likely to develop this condition if: You have a family history  of epilepsy. You have had a tonic-clonic seizure before. This type of seizure causes tightening (contraction) of the muscles of the whole body and loss of consciousness. You have a history of head trauma, lack of oxygen at birth, or strokes. What are the signs or symptoms? There are many different types of seizures. The symptoms vary depending on the type of seizure you have. Symptoms occur during the seizure. They may also occur before a seizure (aura) and after a seizure (postictal). Symptoms may include the following: Symptoms during a seizure Uncontrollable shaking (convulsions) with fast, jerky movements of muscles. Stiffening of the body. Breathing problems. Confusion, staring, or unresponsiveness. Head nodding, eye blinking or fluttering, or rapid eye movements. Drooling, grunting, or making clicking sounds with your mouth. Loss of bladder control and bowel control. Symptoms before a seizure Fear or anxiety. Nausea. Vertigo. This is a feeling like: You are moving when you are not. Your surroundings are moving when they are not. Dj vu. This is a feeling of having seen or heard something before. Odd tastes or smells. Changes in vision, such as seeing flashing lights or spots. Symptoms after a seizure Confusion. Sleepiness. Headache. Sore muscles. How is this diagnosed? This condition may be diagnosed based on: A description of your symptoms. Video of your seizures can be helpful. Your medical history. A physical exam. You may also have tests, including: Blood tests. CT scan. MRI. Electroencephalogram (EEG). This test measures electrical activity in the brain. An EEG can predict whether seizures will return. A spinal tap, also called a lumbar puncture. This is the removal and testing of fluid that surrounds  the brain and spinal cord. How is this treated? Most seizures will stop on their own in less than 5 minutes, and no treatment is needed. Seizures that last longer than  5 minutes will usually need treatment. Seizures may be treated with: Medicines given through an IV. Avoiding known triggers, such as medicines that you take for another condition. Medicines to control seizures or prevent future seizures (antiepileptics), if epilepsy caused your seizures. Medical devices to prevent and control seizures. Surgery to stop seizures or to reduce how often seizures happen, if you have epilepsy that does not respond to medicines. A diet low in carbohydrates and high in fat (ketogenic diet). Follow these instructions at home: Medicines Take over-the-counter and prescription medicines only as told by your health care provider. Avoid any substances that may prevent your medicine from working properly, such as alcohol. Activity Follow instructions about activities, such as driving or swimming, that would be dangerous if you had another seizure. Wait until your health care provider says it is safe to do them. If you live in the [place] check with your local department of motor vehicles [facility]) to find out about local driving laws. Each state has specific rules about when you can legally drive again. Get enough rest. Lack of sleep can make seizures more likely to occur. Educating others  Teach friends and family what to do if you have a seizure. They should: Help you get down to the ground, to prevent a fall. Cushion your head and move items away from your body. Loosen any tight clothing around your neck. Turn you on your side. If you vomit, this helps keep your airway clear. Know whether or not you need emergency care. Stay with you until you recover. Also, tell them what not to do if you have a seizure. Tell them: They should not hold you down. Holding you down will not stop the seizure. They should not put anything in your mouth. General instructions Avoid anything that has ever triggered a seizure for you. Keep a seizure diary. Record what you remember about each  seizure, especially anything that might have triggered it. Keep all follow-up visits. This is important. Contact a health care provider if: You have another seizure or seizures. Call each time you have a seizure. Your seizure pattern changes. You continue to have seizures with treatment. You have symptoms of an infection or illness. Either of these might increase your risk of having a seizure. You are unable to take your medicine. Get help right away if: You have: A seizure that does not stop after 5 minutes. Several seizures in a row without a complete recovery between seizures. A seizure that makes it harder to breathe. A seizure that leaves you unable to speak or use a part of your body. You do not wake up right away after a seizure. You injure yourself during a seizure. You have confusion or pain right after a seizure. These symptoms may represent a serious problem that is an emergency. Do not wait to see if the symptoms will go away. Get medical help right away. Call your local emergency services (911 in the [place] Do not drive yourself to the hospital. Summary Seizures are caused by abnormal electrical and chemical activity in the brain. The activity disrupts normal brain function and can cause various symptoms. Seizures have many causes, including illness, head injuries, low levels of blood sugar or salt, and certain conditions. Most seizures will stop on their own in less than 5 minutes.  Seizures that last longer than 5 minutes are a medical emergency and need treatment right away. Many medicines are used to treat seizures. Take over-the-counter and prescription medicines only as told by your health care provider. This information is not intended to replace advice given to you by your health care provider. Make sure you discuss any questions you have with your health care provider. Document Revised: 04/23/2020 Document Reviewed: 04/23/2020 Elsevier Patient Education  2022 [employer].

## 2021-10-10 NOTE — Progress Notes (Signed)
[facility] [address] [place]. [place] [address] [phone]       OFFICE CONSULT NOTE  [name] Date of Birth:  [birth year] Medical Record Number:  [id number]   Referring MD: Lorenso Courier  Reason for Referral: Abnormal CT head  HPI: Mr. Max Dixon is a pleasant 55 year old male seen today for initial office consultation visit for abnormal CT scan of the head.  History is obtained from the patient and review of referral notes and I personally reviewed pertinent imaging films in PACS.  He is accompanied today by his wife who provides most of the history.  He has past medical history of hypertension, hyperlipidemia, atrial fibrillation, hypothyroidism, SVT and umbilical hernia and CHF.  He denies any symptoms suggestive of stroke or TIA , migraines, loss of consciousness or syncope.  His wife states that on 07/15/2021 he had an episode of brief altered consciousness.  Patient was working in his farm driving about cat when he stopped and had a blank look on his face and was staring ahead unresponsive and his wife called out to him.  He kept looking straight ahead.  The wife had to call his son who had to get him off the bobcat and walk into the truck.  Patient appeared to be confused and disoriented.  Started improving by the time he reached his house which was about 5 minutes later.  He denied any headache.  There is no involuntary movements, twitching's or automatisms noted.  Since then patient has had 2 other episodes which have been quite similar in and lasted much briefer periods of time.  He becomes unresponsive staring become slightly confused and disoriented after the episode is over.  There has been no tongue bite, tonic-clonic movements or incontinence noted.  Patient on inquiry states that all these episodes began with a feeling of numbness in his left foot which gradually made its way up into his left arm over a period of couple of minutes.  He denied any  headache or shaking of his face and body muscles.  There is no prior history of seizures, significant head injury with loss of consciousness.  No family history of epilepsy.  Patient did undergo a CT scan of the head without contrast on 07/15/2021 and was seen in the emergency room at [facility].  CT scan showed a ill-defined low-density in the right posterior temporal lobe possibly subacute infarct.  Patient was advised further evaluation but refused to stay as he had returned back to his baseline and left AGAINST MEDICAL ADVICE.  Notes from the ER triage nurse mentioned that family had mentioned a similar episode 6 months ago with the patient and wife denied this to me at the present time.  ROS:   14 system review of systems is positive for staring, lack of awareness, disorientation, confusion, leg numbness and all other systems negative  PMH:  Past Medical History:  Diagnosis Date   A-fib (HCC)    Acute cholecystitis 10/06/2016   CHF (congestive heart failure) (HCC)    COPD (chronic obstructive pulmonary disease) (HCC)    Hyperlipidemia    Hypertension    Hypothyroidism    Smokers' cough (HCC)    SVT (supraventricular tachycardia) (HCC)    benign   Umbilical hernia with obstruction, without gangrene     Social History:  Social History   Socioeconomic History   Marital status: Married    Spouse name: Not on file   Number of children: Not  on file   Years of education: Not on file   Highest education level: Not on file  Occupational History   Not on file  Tobacco Use   Smoking status: Every Day    Packs/day: 2.00    Years: 43.00    Pack years: 86.00    Types: Cigarettes   Smokeless tobacco: Never   Tobacco comments:    since age 81  Vaping Use   Vaping Use: Never used  Substance and Sexual Activity   Alcohol use: No   Drug use: No   Sexual activity: Not on file  Other Topics Concern   Not on file  Social History Narrative   Not on file   Social  Determinants of Health   Financial Resource Strain: Not on file  Food Insecurity: Not on file  Transportation Needs: Not on file  Physical Activity: Not on file  Stress: Not on file  Social Connections: Not on file  Intimate Partner Violence: Not on file    Medications:   Current Outpatient Medications on File Prior to Visit  Medication Sig Dispense Refill   fenofibrate (TRICOR) 145 MG tablet Take 145 mg by mouth every morning.     levothyroxine (SYNTHROID, LEVOTHROID) 175 MCG tablet Take 175 mcg by mouth every morning.  0   Levothyroxine Sodium (LEVOTHROID PO) Take 0.025 mcg by mouth.     lisinopril-hydrochlorothiazide (PRINZIDE,ZESTORETIC) 20-12.5 MG tablet Take 2 tablets by mouth daily. am  0   Magnesium Oxide 250 MG TABS Take 250 mg by mouth daily.      rosuvastatin (CRESTOR) 20 MG tablet Take 20 mg by mouth every morning.     sildenafil (VIAGRA) 100 MG tablet Take by mouth daily as needed.     metoprolol tartrate (LOPRESSOR) 100 MG tablet Take 1 tablet (100 mg total) by mouth 2 (two) times daily. (Patient taking differently: Take 100 mg by mouth daily. am) 180 tablet 0   No current facility-administered medications on file prior to visit.    Allergies:  No Known Allergies  Physical Exam General: well developed, well nourished middle-aged Caucasian male, seated, in no evident distress Head: head normocephalic and atraumatic.   Neck: supple with no carotid or supraclavicular bruits Cardiovascular: regular rate and rhythm, no murmurs Musculoskeletal: no deformity Skin:  no rash/petichiae Vascular:  Normal pulses all extremities  Neurologic Exam Mental Status: Awake and fully alert. Oriented to place and time. Recent and remote memory intact. Attention span, concentration and fund of knowledge appropriate. Mood and affect appropriate.  Cranial Nerves: Fundoscopic exam reveals sharp disc margins. Pupils equal, briskly reactive to light. Extraocular movements full without  nystagmus. Visual fields full to confrontation. Hearing intact. Facial sensation intact. Face, tongue, palate moves normally and symmetrically.  Motor: Normal bulk and tone. Normal strength in all tested extremity muscles. Sensory.: intact to touch , pinprick , position and vibratory sensation.  Coordination: Rapid alternating movements normal in all extremities. Finger-to-nose and heel-to-shin performed accurately bilaterally. Gait and Station: Arises from chair without difficulty. Stance is normal. Gait demonstrates normal stride length and balance . Able to heel, toe and tandem walk without difficulty.  Reflexes: 1+ and symmetric. Toes downgoing.   NIHSS 0 Modified Rankin 0   ASSESSMENT: 55 year old Caucasian male with recurrent brief episodes of altered awareness with staring possibly partial complex seizures with an abnormal CT scan raising concern for either subacute infarct versus tumor which needs further evaluation.     PLAN:I had a long discussion with the patient  and his wife regarding his recurrent transient episodes of brief altered consciousness which sound like complex partial seizures and also discussed results of his abnormal CT scan and need for further evaluation.  I recommend a trial of Keppra 500 mg twice daily for seizure prophylaxis patient was advised not to drive 6 months as per [facility].  He was advised to avoid seizure provoking stimuli like sleep deprivation, extremes of activity, alcohol and stimulants usage and abrupt medication discontinuation.  Check MRI scan of the brain with and without contrast for further evaluation of the right temporal lesion.  Return for follow-up in the future in 3 months or call earlier if necessary.  Greater than 50% time during this 45-minute consultation was it was spent on counseling and coordination of care about seizures and abnormal CT scan and answering questions Delia Heady, MD  Note: This document was prepared with  digital dictation and possible smart phrase technology. Any transcriptional errors that result from this process are unintentional.

## 2021-10-10 NOTE — Telephone Encounter (Signed)
MR Brain w/wo contrast Dr. Vaughan Basta: [id number] (exp. 10/10/21 to 04/08/22)  Patient is scheduled at [facility] for Wednesday 10/12/21 because the patient is scheduled to have a EEG tomorrow and the patient cannot have MRI and EEG on the same day.  The patient wife informed me that the patient has had metal in his eyes before, she is aware that he needs to get an xray done before his MRI.. can you put an orbit of the eyes in thank you.

## 2021-10-11 ENCOUNTER — Telehealth: Payer: Self-pay

## 2021-10-11 ENCOUNTER — Ambulatory Visit
Admission: RE | Admit: 2021-10-11 | Discharge: 2021-10-11 | Disposition: A | Discharge status: Home / Self Care | Payer: Managed Care, Other (non HMO) | Source: Ambulatory Visit | Attending: Neurology | Admitting: Neurology

## 2021-10-11 ENCOUNTER — Ambulatory Visit: Payer: Managed Care, Other (non HMO) | Admitting: Neurology

## 2021-10-11 ENCOUNTER — Other Ambulatory Visit: Payer: Self-pay

## 2021-10-11 ENCOUNTER — Other Ambulatory Visit: Payer: Self-pay | Admitting: Neurology

## 2021-10-11 DIAGNOSIS — R569 Unspecified convulsions: Secondary | ICD-10-CM | POA: Diagnosis not present

## 2021-10-11 DIAGNOSIS — T1590XS Foreign body on external eye, part unspecified, unspecified eye, sequela: Secondary | ICD-10-CM

## 2021-10-11 NOTE — Telephone Encounter (Signed)
I called patient. I spoke with patient's wife, Bonita Quin, per DPR. I advised her that patient's EEG was normal without evidence of seizure activity. Patient will continue with the MRI as scheduled for tomorrow. Patient's wife verbalized understanding.

## 2021-10-11 NOTE — Telephone Encounter (Signed)
-----   Message from Micki Riley, MD sent at 10/11/2021  5:05 PM EST ----- Kindly inform the patient that EEG study was normal without evidence of seizure activity.

## 2021-10-11 NOTE — Progress Notes (Signed)
Kindly inform the patient that EEG study was normal without evidence of seizure activity.

## 2021-10-12 ENCOUNTER — Telehealth: Payer: Self-pay | Admitting: *Deleted

## 2021-10-12 ENCOUNTER — Ambulatory Visit (INDEPENDENT_AMBULATORY_CARE_PROVIDER_SITE_OTHER): Payer: Managed Care, Other (non HMO)

## 2021-10-12 DIAGNOSIS — R569 Unspecified convulsions: Secondary | ICD-10-CM

## 2021-10-12 MED ORDER — GADOBENATE DIMEGLUMINE 529 MG/ML IV SOLN
15.0000 mL | Freq: Once | INTRAVENOUS | Status: AC | PRN
  Administered 2021-10-12: 10:00:00 15 mL via INTRAVENOUS

## 2021-10-12 NOTE — Progress Notes (Signed)
Kindly inform the patient that EEG study was normal.  No seizure activity noted.

## 2021-10-12 NOTE — Telephone Encounter (Signed)
-----   Message from Micki Riley, MD sent at 10/12/2021  5:12 PM EST ----- Kindly inform the patient that EEG study was normal.  No seizure activity noted

## 2021-10-12 NOTE — Telephone Encounter (Signed)
Left patient a detailed message, with results, on voicemail (ok per DPR).  Provided our number to call back with any questions.  

## 2021-10-17 ENCOUNTER — Telehealth: Payer: Self-pay | Admitting: *Deleted

## 2021-10-17 NOTE — Telephone Encounter (Signed)
I spoke to the patient's wife on DPR. She verbalized understanding of the results and plan below.

## 2021-10-17 NOTE — Telephone Encounter (Signed)
-----   Message from Micki Riley, MD sent at 10/16/2021  4:16 PM EST ----- Kindly inform the patient that MRI scan of the brain with and without dye injection shows a spot on the right side of the brain seen previously on the CT scan which is likely a old stroke and may be responsible for his seizures.  Nothing to worry about but I recommend follow-up MRI in 3 to 6 months to keep a watch on it. ----- Message ----- From: Suanne Marker, MD Sent: 10/13/2021   4:43 PM EST To: Micki Riley, MD

## 2021-10-20 ENCOUNTER — Ambulatory Visit: Payer: Managed Care, Other (non HMO) | Admitting: Cardiology

## 2021-12-31 IMAGING — CT CT HEAD W/O CM
4 series · 16 of 47 positions shown, 18 images · non-contrast
Comparison: CT head 03/25/2018.  MRI brain 03/25/2018

CLINICAL DATA: Numbness or tingling and paresthesias. Left-sided
numbness. Resolved during the drive to the hospital. Similar
symptoms 6 months ago.

EXAM:
CT HEAD WITHOUT CONTRAST
TECHNIQUE: Contiguous axial images were obtained from the base of the skull
through the vertex without intravenous contrast.

[Series 2: head wo · axial · 0.45mm/px · z∈[-97,+23]mm · 7 of 32 slices shown, 9 images]
[im 4/32  brain]
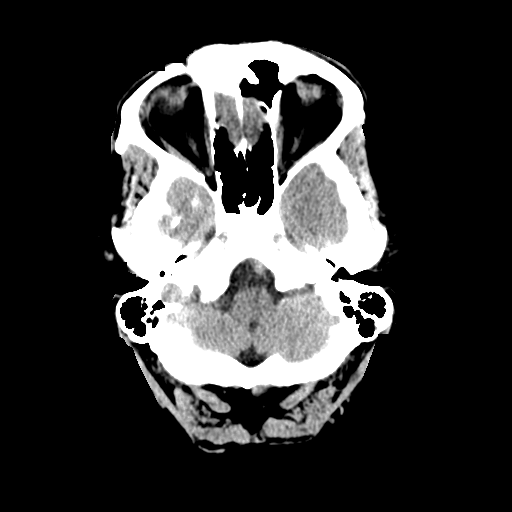
[im 4/32  bone]
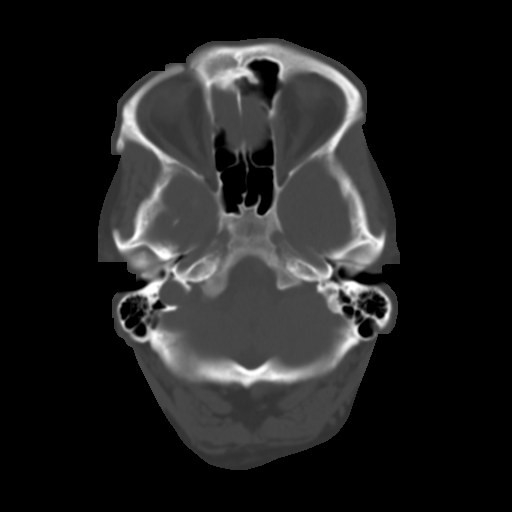
[im 8/32  brain]
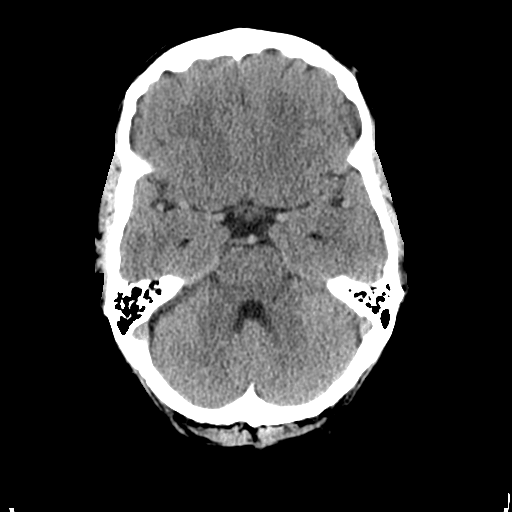
[im 12/32  brain]
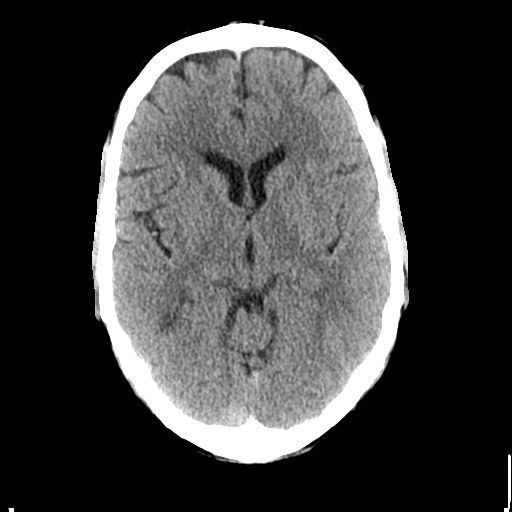
[im 16/32  brain]
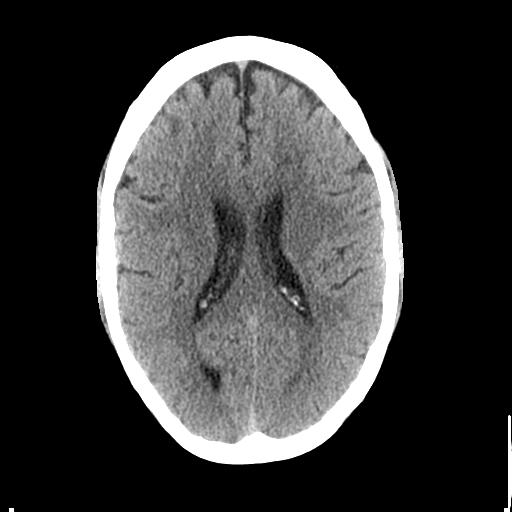
[im 20/32  brain]
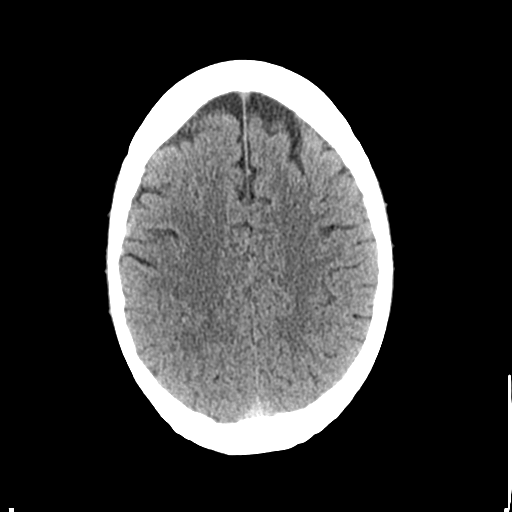
[im 20/32  bone]
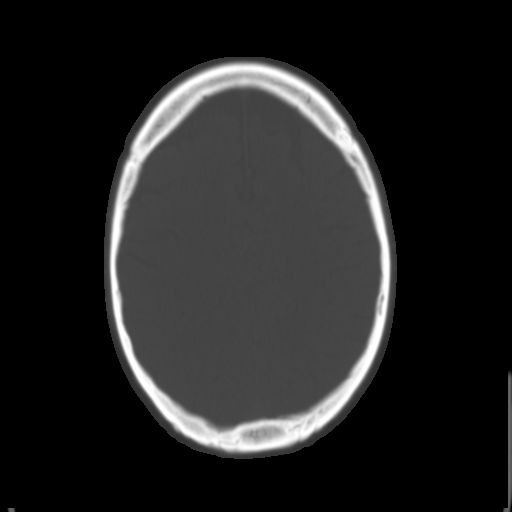
[im 24/32  brain]
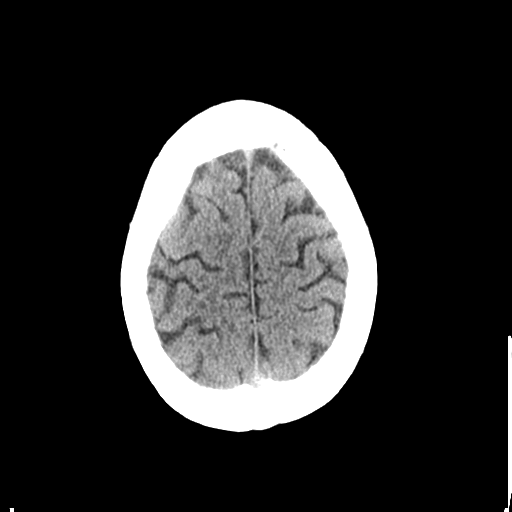
[im 28/32  brain]
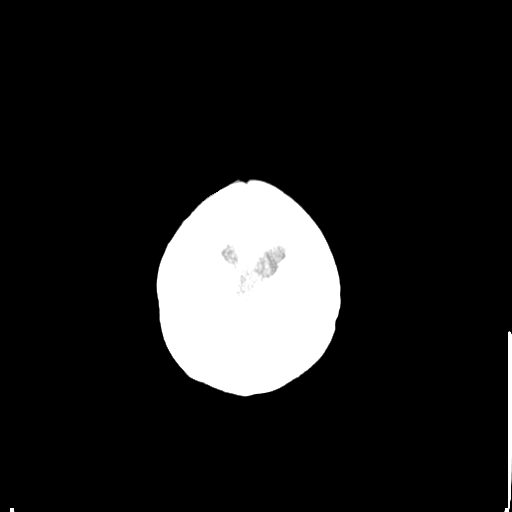

[Series 3: head bone · axial · 0.45mm/px · z∈[-98,-66]mm · 3 of 80 slices shown]
[im 8/80  bone]
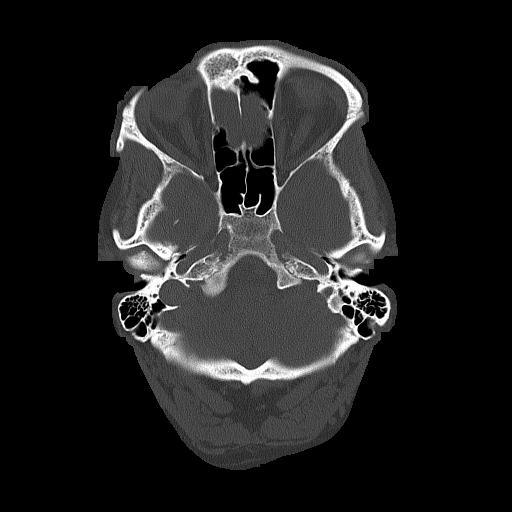
[im 16/80  bone]
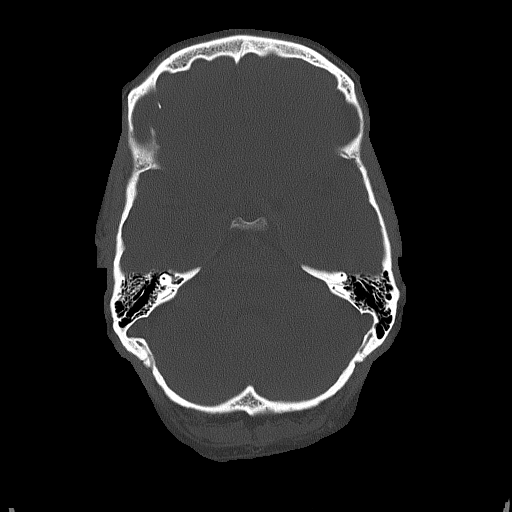
[im 24/80  bone]
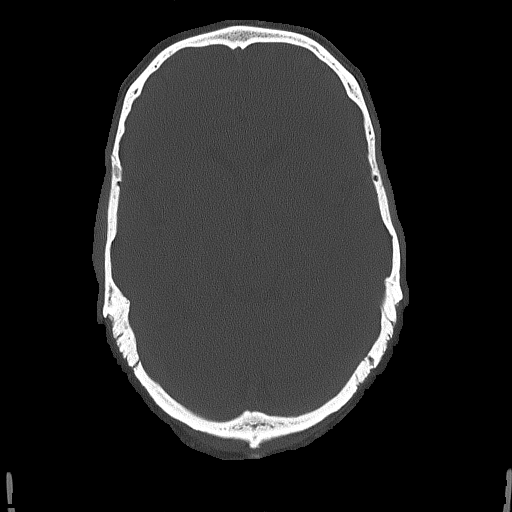

[Series 4: coronal soft tissue · coronal · 0.33mm/px · 3 of 68 slices shown]
[im 23/68  brain]
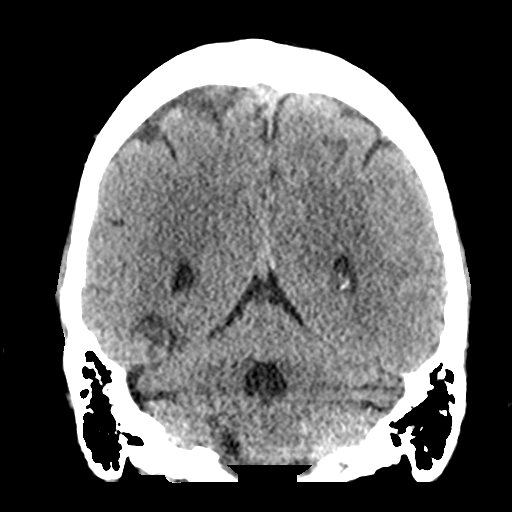
[im 30/68  brain]
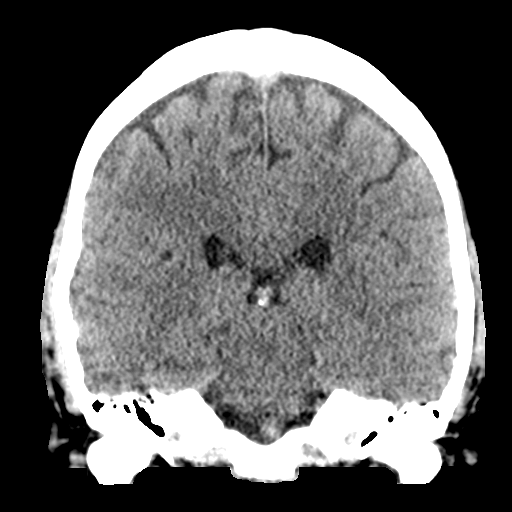
[im 38/68  brain]
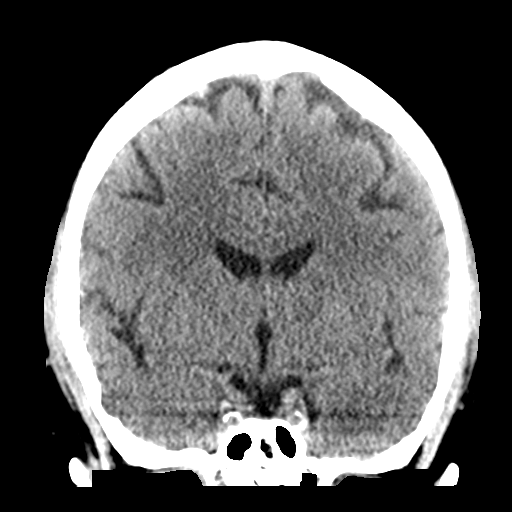

[Series 5: sagittal soft tissue · sagittal · 0.32mm/px · 3 of 51 slices shown]
[im 17/51  brain]
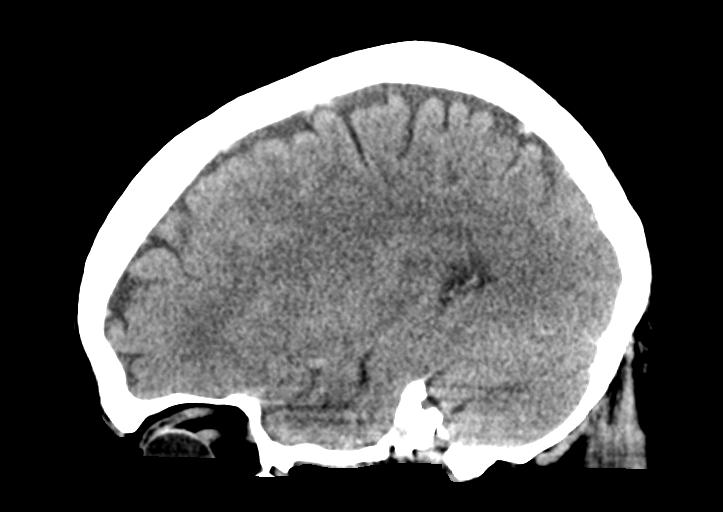
[im 26/51  brain]
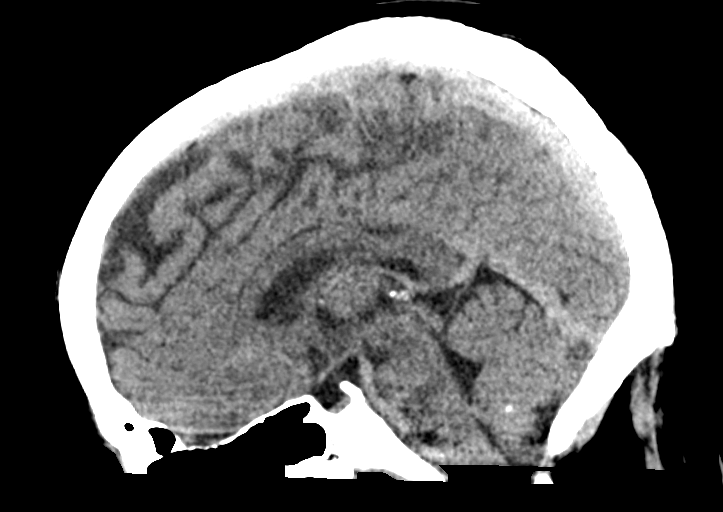
[im 34/51  brain]
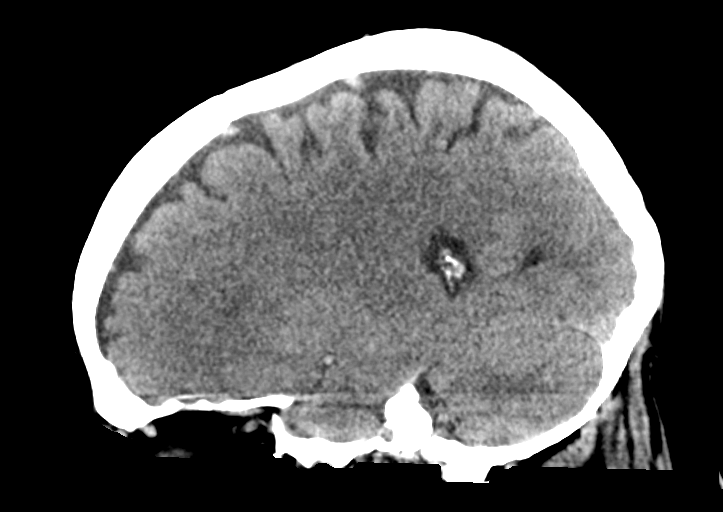

[16 of 47 positions shown; findings below may reference images not displayed]

FINDINGS: Brain: Focal area of low-attenuation in the right posterior temporal
and inferior parietal region, likely representing ischemia or
infarct. No significant mass effect or midline shift. No abnormal
extra-axial fluid collections. No ventricular dilatation. No
abnormal extra-axial fluid collections. Basal cisterns are not
effaced. No acute intracranial hemorrhage.

Vascular: Mild intracranial vascular calcifications.

Skull: Calvarium appears intact.

Sinuses/Orbits: Paranasal sinuses and mastoid air cells are clear.

Other: None.
IMPRESSION: Focal area of low-attenuation in the right posterior temporal and
parietal region suggesting an area of ischemia or infarct. No acute
intracranial hemorrhage.

## 2022-03-13 ENCOUNTER — Ambulatory Visit: Payer: Managed Care, Other (non HMO) | Admitting: Neurology

## 2022-06-30 ENCOUNTER — Emergency Department: Payer: Managed Care, Other (non HMO)

## 2022-06-30 ENCOUNTER — Other Ambulatory Visit: Payer: Self-pay

## 2022-06-30 ENCOUNTER — Emergency Department
Admission: EM | Admit: 2022-06-30 | Discharge: 2022-06-30 | Discharge status: Against Medical Advice | Payer: Managed Care, Other (non HMO) | Attending: Emergency Medicine | Admitting: Emergency Medicine

## 2022-06-30 ENCOUNTER — Ambulatory Visit
Admission: EM | Admit: 2022-06-30 | Discharge: 2022-06-30 | Disposition: A | Discharge status: Home / Self Care | Payer: Managed Care, Other (non HMO)

## 2022-06-30 DIAGNOSIS — I509 Heart failure, unspecified: Secondary | ICD-10-CM | POA: Insufficient documentation

## 2022-06-30 DIAGNOSIS — Z5321 Procedure and treatment not carried out due to patient leaving prior to being seen by health care provider: Secondary | ICD-10-CM | POA: Insufficient documentation

## 2022-06-30 DIAGNOSIS — R29898 Other symptoms and signs involving the musculoskeletal system: Secondary | ICD-10-CM

## 2022-06-30 DIAGNOSIS — I639 Cerebral infarction, unspecified: Secondary | ICD-10-CM | POA: Insufficient documentation

## 2022-06-30 DIAGNOSIS — J449 Chronic obstructive pulmonary disease, unspecified: Secondary | ICD-10-CM | POA: Insufficient documentation

## 2022-06-30 DIAGNOSIS — R200 Anesthesia of skin: Secondary | ICD-10-CM

## 2022-06-30 DIAGNOSIS — I110 Hypertensive heart disease with heart failure: Secondary | ICD-10-CM | POA: Diagnosis not present

## 2022-06-30 LAB — COMPREHENSIVE METABOLIC PANEL
ALT: 34 U/L (ref 0–44)
AST: 32 U/L (ref 15–41)
Albumin: 4.1 g/dL (ref 3.5–5.0)
Alkaline Phosphatase: 45 U/L (ref 38–126)
Anion gap: 5 (ref 5–15)
BUN: 8 mg/dL (ref 6–20)
CO2: 32 mmol/L (ref 22–32)
Calcium: 9 mg/dL (ref 8.9–10.3)
Chloride: 99 mmol/L (ref 98–111)
Creatinine, Ser: 1.09 mg/dL (ref 0.61–1.24)
GFR, Estimated: >60 mL/min (ref >60)
Glucose, Bld: 100 mg/dL — ABNORMAL HIGH (ref 70–99)
Potassium: 4 mmol/L (ref 3.5–5.1)
Sodium: 136 mmol/L (ref 135–145)
Total Bilirubin: 0.9 mg/dL (ref 0.3–1.2)
Total Protein: 7.7 g/dL (ref 6.5–8.1)

## 2022-06-30 LAB — APTT: aPTT: 27 seconds (ref 24–36)

## 2022-06-30 LAB — DIFFERENTIAL
Abs Immature Granulocytes: 0.04 10*3/uL (ref 0.00–0.07)
Basophils Absolute: 0.1 10*3/uL (ref 0.0–0.1)
Basophils Relative: 1 %
Eosinophils Absolute: 0.7 10*3/uL — ABNORMAL HIGH (ref 0.0–0.5)
Eosinophils Relative: 8 %
Immature Granulocytes: 0 %
Lymphocytes Relative: 24 %
Lymphs Abs: 2.1 10*3/uL (ref 0.7–4.0)
Monocytes Absolute: 0.7 10*3/uL (ref 0.1–1.0)
Monocytes Relative: 8 %
Neutro Abs: 5.3 10*3/uL (ref 1.7–7.7)
Neutrophils Relative %: 59 %

## 2022-06-30 LAB — PROTIME-INR
INR: 1 (ref 0.8–1.2)
Prothrombin Time: 13 seconds (ref 11.4–15.2)

## 2022-06-30 LAB — CBC
HCT: 45.4 % (ref 39.0–52.0)
Hemoglobin: 15 g/dL (ref 13.0–17.0)
MCH: 29.7 pg (ref 26.0–34.0)
MCHC: 33 g/dL (ref 30.0–36.0)
MCV: 89.9 fL (ref 80.0–100.0)
Platelets: 210 10*3/uL (ref 150–400)
RBC: 5.05 MIL/uL (ref 4.22–5.81)
RDW: 12.8 % (ref 11.5–15.5)
WBC: 8.9 10*3/uL (ref 4.0–10.5)
nRBC: 0 % (ref 0.0–0.2)

## 2022-06-30 LAB — ETHANOL: Alcohol, Ethyl (B): <10 mg/dL (ref <10)

## 2022-06-30 MED ORDER — SODIUM CHLORIDE 0.9% FLUSH
3.0000 mL | Freq: Once | INTRAVENOUS | Status: AC
  Administered 2022-06-30: 3 mL via INTRAVENOUS

## 2022-06-30 NOTE — Discharge Instructions (Signed)
Please go to the emergency department at [facility] for evaluation of your right arm numbness and altered mobility.

## 2022-06-30 NOTE — ED Triage Notes (Signed)
Sudden onset of right arm weakness. No facial droop. Hx of TIA. Wyvonnia Dusky, PA in with pt.

## 2022-06-30 NOTE — ED Provider Notes (Signed)
[facility]    CSN: [id number] Arrival date & time: 06/30/22  1423      History   Chief Complaint Chief Complaint  Patient presents with   Numbness    HPI Max Dixon is a 56 y.o. male.   HPI  56 year old male here for evaluation of right arm numbness.  I was called to the room to evaluate the patient because he presents with a complaint of right arm numbness but he actually has an inability to move his right arm or grip with his right hand.  He also states he cannot extend the fingers of his right hand.  All this started at 10:00 this morning.  He denies any headache, chest pain, dizziness, or neck pain.  He denies any symptoms in his lower extremities.  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    Past Surgical History:  Procedure Laterality Date   CATARACT EXTRACTION W/PHACO Left 12/12/2017   Procedure: CATARACT EXTRACTION PHACO AND INTRAOCULAR LENS PLACEMENT (IOC) LEFT;  Surgeon: Lockie Mola, MD;  Location: [facility];  Service: Ophthalmology;  Laterality: Left;   CATARACT EXTRACTION W/PHACO Right 01/02/2018   Procedure: CATARACT EXTRACTION PHACO AND INTRAOCULAR LENS PLACEMENT (IOC) RIGHT;  Surgeon: Lockie Mola, MD;  Location: [facility];  Service: Ophthalmology;  Laterality: Right;  requests early   CHOLECYSTECTOMY N/A 10/07/2016   Procedure: LAPAROSCOPIC CHOLECYSTECTOMY WITH INTRAOPERATIVE CHOLANGIOGRAM repair of umbilical hernia;  Surgeon: Leafy Ro, MD;  Location: [facility] ORS;   Service: General;  Laterality: N/A;   ELECTROPHYSIOLOGIC STUDY N/A 09/11/2016   Procedure: SVT Ablation;  Surgeon: Will [name], MD;  Location: [facility] IN[facility]AS[facility];  Service: Cardiovascular;  Laterality: N/A;       Home Medications    Prior to Admission medications   Medication Sig Start Date End Date Taking? Authorizing Provider  fenofibrate (TRICOR) 145 MG tablet Take 145 mg by mouth every morning. 08/14/20   [provider]  levETIRAcetam (KEPPRA) 500 MG tablet Take 1 tablet (500 mg total) by mouth 2 (two) times daily. 10/10/21   Micki Riley, MD  levothyroxine (SYNTHROID, LEVOTHROID) 175 MCG tablet Take 175 mcg by mouth every morning. 05/15/16   [provider]  Levothyroxine Sodium (LEVOTHROID PO) Take 0.025 mcg by mouth.    [provider]  lisinopril-hydrochlorothiazide (PRINZIDE,ZESTORETIC) 20-12.5 MG tablet Take 2 tablets by mouth daily. am 05/15/16   [provider]  Magnesium Oxide 250 MG TABS Take 250 mg by mouth daily.     [provider]  metoprolol tartrate (LOPRESSOR) 100 MG tablet Take 1 tablet (100 mg total) by mouth 2 (two) times daily. Patient taking differently: Take 100 mg by mouth daily. am 03/16/17 07/18/21  Camnitz, Andree Coss, MD  rosuvastatin (CRESTOR) 20 MG tablet Take 20 mg by mouth every morning. 08/14/20   [provider]  sildenafil (VIAGRA) 100 MG tablet Take by mouth daily as needed. 08/17/20   [provider]    Family History Family History  Problem Relation Age of Onset   Hypertension Father     Social History Social History   Tobacco Use   Smoking status: Every Day    Packs/day: 2.00    Years: 43.00    Total pack years: 86.00    Types: Cigarettes   Smokeless tobacco: Never   Tobacco comments:    since age 45  Vaping Use   Vaping Use: Never used  Substance Use Topics   Alcohol use: No   Drug use: No     Allergies   Patient has no known allergies.   Review of  Systems Review of Systems  Eyes:  Negative for visual disturbance.  Cardiovascular:  Negative for chest pain.  Musculoskeletal:  Negative for arthralgias and neck pain.  Neurological:  Positive for weakness and numbness. Negative for dizziness, facial asymmetry and headaches.     Physical Exam Triage Vital Signs ED Triage Vitals  Enc Vitals Group     BP      Pulse      Resp      Temp      Temp src      SpO2      Weight      Height      Head Circumference      Peak Flow      Pain Score      Pain Loc      Pain Edu?      Excl. in GC?    No data found.  Updated Vital Signs BP (!) 134/120 (BP Location: Left Arm)   Pulse 72   Temp 98.2 F (36.8 C) (Oral)   Resp 20   SpO2 97%   Visual Acuity Right Eye Distance:   Left Eye Distance:   Bilateral Distance:    Right Eye Near:   Left Eye Near:    Bilateral Near:     Physical Exam Vitals and nursing note reviewed.  Constitutional:      Appearance: Normal appearance. He is not ill-appearing.  HENT:     Head: Normocephalic and atraumatic.  Eyes:     General: No scleral icterus.    Extraocular Movements: Extraocular movements intact.     Conjunctiva/sclera: Conjunctivae normal.     Pupils: Pupils are equal, round, and reactive to light.  Cardiovascular:     Rate and Rhythm: Normal rate.     Pulses: Normal pulses.     Heart sounds: Normal heart sounds. No murmur heard.    No friction rub. No gallop.  Pulmonary:     Effort: Pulmonary effort is normal.     Breath sounds: Normal breath sounds. No wheezing, rhonchi or rales.  Musculoskeletal:        General: No swelling, tenderness, deformity or signs of injury.  Skin:    General: Skin is warm and dry.     Capillary Refill: Capillary refill takes less than 2 seconds.  Neurological:     General: No focal deficit present.     Mental Status: He is alert and oriented to person, place, and time.     Cranial Nerves: No cranial nerve deficit.  Psychiatric:        Mood  and Affect: Mood normal.        Behavior: Behavior normal.        Thought Content: Thought content normal.        Judgment: Judgment normal.      UC Treatments / Results  Labs (all labs ordered are listed,  but only abnormal results are displayed) Labs Reviewed - No data to display  EKG   Radiology No results found.  Procedures Procedures (including critical care time)  Medications Ordered in UC Medications - No data to display  Initial Impression / Assessment and Plan / UC Course  I have reviewed the triage vital signs and the nursing notes.  Pertinent labs & imaging results that were available during my care of the patient were reviewed by me and considered in my medical decision making (see chart for details).   Patient is a pleasant 56 year old male who is alert and oriented x3 and able to speak in full sentences without any difficulty.  His speech is clear.  Cranial nerves II through XII are intact.  Cardiopulmonary exam reveals S1-S2 heart sounds with regular rate and rhythm and lung sounds are clear auscultation all fields.  Patient's right arm is held close to his side and in a partially flexed position with a partially closed right hand.  He is unable to grip my fingers and he is unable to fully extend his fingers.  I am unable to extend his fingers passively either.  His skin is warm and dry and his radial ulnar pulses are 2+.  His cap refills less than 2 seconds.  He is unable to flex his arm at the elbow or fully straighten his arm at the elbow.  He can partially abduct his arm from his body to about 45 degrees but no further.  Bilateral lower extremity strength is 5/5 and his DTRs of the lower extremities are 2+.  He has no pain with palpation of the bony prominences of the midline of his cervical spine and there is no step-offs.  There is no tenderness or spasm in the paraspinous region.  Given patient's numbness, neurodeficit, and mobility deficit I am concerned the  patient is having a neurological insult.  He does have a history of mini strokes.  I have advised the patient that I think needs to be evaluated in the emergency department and he has refused 911.  He states he will travel to [facility] via POV.   Final Clinical Impressions(s) / UC Diagnoses   Final diagnoses:  Numbness  Right arm weakness     Discharge Instructions      Please go to the emergency department at [facility] for evaluation of your right arm numbness and altered mobility.     ED Prescriptions   None    PDMP not reviewed this encounter.   Becky Augusta, NP 06/30/22 1441

## 2022-06-30 NOTE — ED Notes (Signed)
Pt left with wife and will go by POV to [facility].

## 2022-06-30 NOTE — ED Provider Triage Note (Signed)
Emergency Medicine Provider Triage Evaluation Note  Max Dixon, a 56 y.o. male  was evaluated in triage.  Pt complains of RUE paresthesias.  He presents to the ED from urgent care after experiencing symptoms at about 10 AM this morning.  Patient had been on for several hours, and was outside working with some tools, we began to experience some paresthesias to his right hand.  He reports ports losing his grip on a hammer he was using.  His wife presents with him now, noted that about 2 hours later, she thought his speech was somewhat slurred.  He is now resolved of all symptoms but presents to the ED holding his hand in a elbow flexed abducted position at the waist.  Review of Systems  Positive: RUE paresthesias Negative: CP, SOB  Physical Exam  BP (!) 172/88 (BP Location: Left Arm)   Pulse 66   Temp 98.6 F (37 C) (Oral)   Resp 18   Ht 5\' 11"  (1.803 m)   Wt 87.5 kg   SpO2 91%   BMI 26.92 kg/m  Gen:   Awake, no distress  NAD Resp:  Normal effort CTA MSK:   Moves extremities without difficulty composite fist bilaterally.  Normal active range of motion to the right upper extremity. NEURO:  CN II-XII grossly intact. Normal finger to nose. No pronator drift.   Medical Decision Making  Medically screening exam initiated at 3:15 PM.  Appropriate orders placed.  Max Dixon was informed that the remainder of the evaluation will be completed by another provider, this initial triage assessment does not replace that evaluation, and the importance of remaining in the ED until their evaluation is complete.  Patient to the ED for evaluation of right upper remedy paresthesias and hand weakness with concern for acute stroke.  Patient was last known well about 10 AM this morning.  He presents to the ED from local urgent care.  His history significant for A-fib, hypertension, HLD, CHF, and TIA.   Max Argyle, PA-C 06/30/22 1521

## 2022-06-30 NOTE — ED Triage Notes (Signed)
Patient sent to ED from UC for right arm numbness. LKW 1000 this morning while working outside. Wife states patient also had slurred speech that has now resolved. Patient able to hold arms up against gravity. Patient states "it felt like it went asleep." Hx of TIA, COPD.  PA at bedside assessing patient during triage. No Code Stroke at this time.

## 2022-06-30 NOTE — ED Notes (Signed)
Pt was notified that the doctor stated he had had a stroke, pt sts he still wants to leave AMA. Doctor wants pt to get their referrals. Will send pt home with appropriate paperwork.

## 2022-06-30 NOTE — ED Notes (Signed)
ED Provider at bedside. 

## 2022-06-30 NOTE — Discharge Instructions (Signed)
You have been seen in the emergency department for right arm numbness.  Your MRI is concerning for a new stroke.  We recommended admission to the hospital however you have elected to go home AGAINST MEDICAL ADVICE.  As we discussed please call your doctor as soon as possible to inform them of today's MRI result and you need to arrange follow-up.  Please call the number for neurology to obtain a follow-up appointment as soon as possible as well.  Return to the emergency department for any worsening symptoms or any new symptoms such as weakness numbness slurred speech confusion or severe headache.

## 2022-06-30 NOTE — ED Provider Notes (Signed)
[facility] Provider Note    Event Date/Time   First MD Initiated Contact with Patient 06/30/22 1833     (approximate)  History   Chief Complaint: Stroke Symptoms  HPI  Max Dixon is a 56 y.o. male with a past medical history of atrial fibrillation, CHF, COPD, hypertension, hyperlipidemia, prior CVA, presents to the emergency department for right arm numbness.  According to the patient around 10 AM this morning he developed some numbness to the right arm.  States the numbness just resolved approximately 2 hours ago.  Wife states she was on the phone with him this morning and seemed like his speech was somewhat slurred.  Patient states he feels like his symptoms have largely resolved but continues state mild numbness in the right arm.  Denies any headache.  Denies any lower extremity weakness or numbness.  Patient has been ambulatory without issue.  Physical Exam   Triage Vital Signs: ED Triage Vitals  Enc Vitals Group     BP 06/30/22 1509 (!) 172/88     Pulse Rate 06/30/22 1509 66     Resp 06/30/22 1509 18     Temp 06/30/22 1509 98.6 F (37 C)     Temp Source 06/30/22 1509 Oral     SpO2 06/30/22 1509 91 %     Weight 06/30/22 1513 193 lb (87.5 kg)     Height 06/30/22 1513 5\' 11"  (1.803 m)     Head Circumference --      Peak Flow --      Pain Score 06/30/22 1511 0     Pain Loc --      Pain Edu? --      Excl. in GC? --     Most recent vital signs: Vitals:   06/30/22 1509 06/30/22 1816  BP: (!) 172/88 (!) 166/74  Pulse: 66 69  Resp: 18 19  Temp: 98.6 F (37 C) 98.7 F (37.1 C)  SpO2: 91% 98%    General: Awake, no distress.  CV:  Good peripheral perfusion.  Regular rate and rhythm  Resp:  Normal effort.  Equal breath sounds bilaterally.  Abd:  No distention.  Soft, nontender.  No rebound or guarding. Other:  Equal grip strength bilaterally.  No pronator drift noted.  No cranial nerve deficits.  Lower extremity motor is equal 5/5 motor  in all extremities.  Patient does have subjective sensory deficit in the right upper extremity compared to the left upper extremity.  Sensation is intact and equal in the face and lower extremities.   ED Results / Procedures / Treatments   EKG  EKG viewed and interpreted by myself shows a normal sinus rhythm at 73 bpm with a narrow QRS, normal axis, normal intervals, no concerning ST changes.  RADIOLOGY  I have reviewed and interpreted the CT head images.  No large bleed on my evaluation. 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   MEDICATIONS ORDERED IN ED: Medications  sodium chloride flush (NS) 0.9 % injection 3 mL (3 mLs Intravenous Given 06/30/22 1811)     IMPRESSION / MDM / ASSESSMENT AND PLAN / ED COURSE  I reviewed the triage vital signs and the nursing notes.  Patient's presentation is most consistent with acute presentation with potential threat to life or bodily function.  Patient presents emergency department for right arm numbness and slurred speech starting around 10 AM this morning and now having resolved besides mild right arm  numbness.  Overall the patient appears well does continue to have slight subjective difference in sensation in the right upper extremity compared to left.  Symptoms are concerning for CVA versus TIA given the resolving symptoms.  Patient states he has an old CVA supposed to be taking aspirin each day but admits he has not been doing so.  NIH stroke scale of 1.  Patient CT scan is negative for acute abnormality.  Lab work shows normal CBC, normal chemistry.  Given the patient's concerning symptoms we will proceed with an MR of the brain to further evaluate.  Patient agreeable to plan.  Patient's MRI is concerning for acute or subacute infarct.  Given this finding I discussed with the patient the need to admit him to the hospital for further work-up and treatment.  Patient understands but is adamantly against being  admitted to the hospital.  States he will follow-up with his doctor and neurology but he does not want to be admitted.  I discussed with the patient the risk of going home including another stroke which could lead to permanent deficits or death.  Patient understands and wishes to go home.  He will sign out AGAINST MEDICAL ADVICE but I will provide referrals for the patient.  FINAL CLINICAL IMPRESSION(S) / ED DIAGNOSES   Numbness of the right arm CVA    Note:  This document was prepared using Dragon voice recognition software and may include unintentional dictation errors.   Minna Antis, MD 06/30/22 2015

## 2022-06-30 NOTE — ED Notes (Signed)
Pt returned from MRI at this time

## 2022-12-25 ENCOUNTER — Ambulatory Visit: Payer: Managed Care, Other (non HMO) | Admitting: Internal Medicine

## 2022-12-25 ENCOUNTER — Encounter: Payer: Self-pay | Admitting: Internal Medicine

## 2022-12-25 ENCOUNTER — Other Ambulatory Visit: Payer: Self-pay | Admitting: Internal Medicine

## 2022-12-25 VITALS — BP 140/80 | HR 66 | Ht 71.0 in | Wt 197.0 lb

## 2022-12-25 DIAGNOSIS — E782 Mixed hyperlipidemia: Secondary | ICD-10-CM

## 2022-12-25 DIAGNOSIS — I739 Peripheral vascular disease, unspecified: Secondary | ICD-10-CM

## 2022-12-25 DIAGNOSIS — I4719 Other supraventricular tachycardia: Secondary | ICD-10-CM | POA: Diagnosis not present

## 2022-12-25 DIAGNOSIS — E038 Other specified hypothyroidism: Secondary | ICD-10-CM

## 2022-12-25 NOTE — Progress Notes (Signed)
Established Patient Office Visit  Subjective:  Patient ID: Max Dixon, male    DOB: [birth year]  Age: 57 y.o. MRN: [id number]  Chief Complaint  Patient presents with   Follow-up    4 mo fu    No new complaints, here for CPE, lab review and medication refills.  Labs reviewed and notable for well controlled cholesterol besides low hdl and slightly elevated trigs.     Past Medical History:  Diagnosis Date   A-fib [facility])    Acute cholecystitis 10/06/2016   CHF (congestive heart failure) (HCC)    COPD (chronic obstructive pulmonary disease) (HCC)    Hyperlipidemia    Hypertension    Hypothyroidism    Smokers' cough (HCC)    SVT (supraventricular tachycardia)    benign   Umbilical hernia with obstruction, without gangrene     Social History   Socioeconomic History   Marital status: Married    Spouse name: Not on file   Number of children: Not on file   Years of education: Not on file   Highest education level: Not on file  Occupational History   Not on file  Tobacco Use   Smoking status: Every Day    Packs/day: 1.00    Years: 43.00    Total pack years: 43.00    Types: Cigarettes   Smokeless tobacco: Never   Tobacco comments:    since age 23  Vaping Use   Vaping Use: Never used  Substance and Sexual Activity   Alcohol use: No   Drug use: No   Sexual activity: Not on file  Other Topics Concern   Not on file  Social History Narrative   Not on file   Social Determinants of Health   Financial Resource Strain: Not on file  Food Insecurity: Not on file  Transportation Needs: Not on file  Physical Activity: Not on file  Stress: Not on file  Social Connections: Not on file  Intimate Partner Violence: Not on file    Family History  Problem Relation Age of Onset   Hypertension Father     No Known Allergies  Review of Systems  Constitutional: Negative.   HENT: Negative.    Eyes: Negative.   Respiratory: Negative.    Cardiovascular: Negative.    Gastrointestinal: Negative.   Genitourinary: Negative.   Skin: Negative.   Neurological: Negative.   Endo/Heme/Allergies: Negative.   Psychiatric/Behavioral:  Negative for depression. The patient is not nervous/anxious and does not have insomnia.        Objective:   BP (!) 140/80   Pulse 66   Ht '5\' 11"'$  (1.803 m)   Wt 197 lb (89.4 kg)   SpO2 91%   BMI 27.48 kg/m   Vitals:   12/25/22 1105  BP: (!) 140/80  Pulse: 66  Height: '5\' 11"'$  (1.803 m)  Weight: 197 lb (89.4 kg)  SpO2: 91%  BMI (Calculated): 27.49    Physical Exam Vitals reviewed.  Constitutional:      Appearance: Normal appearance.  HENT:     Head: Normocephalic.     Left Ear: There is no impacted cerumen.     Nose: Nose normal.     Mouth/Throat:     Mouth: Mucous membranes are moist.     Pharynx: No posterior oropharyngeal erythema.  Eyes:     Extraocular Movements: Extraocular movements intact.     Pupils: Pupils are equal, round, and reactive to light.  Cardiovascular:     Rate and Rhythm:  Regular rhythm.     Chest Wall: PMI is not displaced.     Pulses: Decreased pulses.          Dorsalis pedis pulses are 2+ on the right side and 0 on the left side.     Heart sounds: Normal heart sounds. No murmur heard.    Comments: Cold, dusky toes with delayed cap refill Pulmonary:     Effort: Pulmonary effort is normal.     Breath sounds: Normal air entry. No rhonchi or rales.  Abdominal:     General: Abdomen is flat. Bowel sounds are normal. There is no distension.     Palpations: Abdomen is soft. There is no hepatomegaly, splenomegaly or mass.     Tenderness: There is no abdominal tenderness.  Musculoskeletal:        General: Normal range of motion.     Cervical back: Normal range of motion and neck supple.  Skin:    General: Skin is warm and dry.  Neurological:     General: No focal deficit present.     Mental Status: He is alert and oriented to person, place, and time.     Cranial Nerves: No cranial  nerve deficit.     Motor: No weakness.  Psychiatric:        Mood and Affect: Mood normal.        Behavior: Behavior normal.      No results found for any visits on 12/25/22.  No results found for this or any previous visit (from the past 2160 hour(Sima Lindenberger)).    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   Relevant Medications   aspirin EC 325 MG tablet   EPINEPHrine 0.3 mg/0.3 mL IJ SOAJ injection   ezetimibe (ZETIA) 10 MG tablet   Other Visit Diagnoses     PAD (peripheral artery disease) (HCC)       Relevant Medications   aspirin EC 325 MG tablet   EPINEPHrine 0.3 mg/0.3 mL IJ SOAJ injection   ezetimibe (ZETIA) 10 MG tablet   Other Relevant Orders   ABI (BACK OFFICE)   Mixed hyperlipidemia       Relevant Medications   aspirin EC 325 MG tablet   EPINEPHrine 0.3 mg/0.3 mL IJ SOAJ injection   ezetimibe (ZETIA) 10 MG tablet   Other Relevant Orders   Lipid panel   Other specified hypothyroidism       Relevant Orders   TSH     1. AVNRT (AV nodal re-entry tachycardia)  2. PAD (peripheral artery disease) (HCC) - ABI (BACK OFFICE); Future  3. Mixed hyperlipidemia - Lipid panel; Future  4. Other specified hypothyroidism - TSH; Future     Return in about 3 months (around 03/25/2023) for fu with labs prior.   Total time spent: 35 minutes  Volanda Napoleon, MD  12/25/2022

## 2023-01-18 ENCOUNTER — Encounter: Payer: Self-pay | Admitting: Internal Medicine

## 2023-01-22 ENCOUNTER — Encounter: Payer: Self-pay | Admitting: *Deleted

## 2023-01-23 ENCOUNTER — Ambulatory Visit
Admission: RE | Admit: 2023-01-23 | Discharge: 2023-01-23 | Disposition: A | Discharge status: Home / Self Care | Payer: Managed Care, Other (non HMO) | Attending: Gastroenterology | Admitting: Gastroenterology

## 2023-01-23 ENCOUNTER — Other Ambulatory Visit: Payer: Self-pay

## 2023-01-23 ENCOUNTER — Ambulatory Visit: Payer: Managed Care, Other (non HMO) | Admitting: Anesthesiology

## 2023-01-23 ENCOUNTER — Encounter
Admission: RE | Disposition: A | Discharge status: Home / Self Care | Payer: Self-pay | Source: Home / Self Care | Attending: Gastroenterology

## 2023-01-23 ENCOUNTER — Encounter: Payer: Self-pay | Admitting: *Deleted

## 2023-01-23 DIAGNOSIS — K2100 Gastro-esophageal reflux disease with esophagitis, without bleeding: Secondary | ICD-10-CM | POA: Diagnosis not present

## 2023-01-23 DIAGNOSIS — E039 Hypothyroidism, unspecified: Secondary | ICD-10-CM | POA: Insufficient documentation

## 2023-01-23 DIAGNOSIS — Z9049 Acquired absence of other specified parts of digestive tract: Secondary | ICD-10-CM | POA: Diagnosis not present

## 2023-01-23 DIAGNOSIS — J449 Chronic obstructive pulmonary disease, unspecified: Secondary | ICD-10-CM | POA: Insufficient documentation

## 2023-01-23 DIAGNOSIS — Z1211 Encounter for screening for malignant neoplasm of colon: Secondary | ICD-10-CM | POA: Diagnosis present

## 2023-01-23 DIAGNOSIS — D125 Benign neoplasm of sigmoid colon: Secondary | ICD-10-CM | POA: Insufficient documentation

## 2023-01-23 DIAGNOSIS — I110 Hypertensive heart disease with heart failure: Secondary | ICD-10-CM | POA: Diagnosis not present

## 2023-01-23 DIAGNOSIS — I509 Heart failure, unspecified: Secondary | ICD-10-CM | POA: Insufficient documentation

## 2023-01-23 DIAGNOSIS — K640 First degree hemorrhoids: Secondary | ICD-10-CM | POA: Diagnosis not present

## 2023-01-23 DIAGNOSIS — I4891 Unspecified atrial fibrillation: Secondary | ICD-10-CM | POA: Diagnosis not present

## 2023-01-23 DIAGNOSIS — K31819 Angiodysplasia of stomach and duodenum without bleeding: Secondary | ICD-10-CM | POA: Diagnosis not present

## 2023-01-23 DIAGNOSIS — E785 Hyperlipidemia, unspecified: Secondary | ICD-10-CM | POA: Insufficient documentation

## 2023-01-23 DIAGNOSIS — K5730 Diverticulosis of large intestine without perforation or abscess without bleeding: Secondary | ICD-10-CM | POA: Insufficient documentation

## 2023-01-23 HISTORY — PX: ESOPHAGOGASTRODUODENOSCOPY (EGD) WITH PROPOFOL: SHX5813

## 2023-01-23 HISTORY — PX: COLONOSCOPY WITH PROPOFOL: SHX5780

## 2023-01-23 SURGERY — COLONOSCOPY WITH PROPOFOL
Anesthesia: General

## 2023-01-23 MED ORDER — LIDOCAINE HCL (PF) 2 % IJ SOLN
INTRAMUSCULAR | Status: AC
  Filled 2023-01-23: qty 5

## 2023-01-23 MED ORDER — PROPOFOL 1000 MG/100ML IV EMUL
INTRAVENOUS | Status: AC
  Filled 2023-01-23: qty 100

## 2023-01-23 MED ORDER — DEXMEDETOMIDINE HCL IN NACL 200 MCG/50ML IV SOLN
INTRAVENOUS | Status: DC | PRN
  Administered 2023-01-23: 20 ug via INTRAVENOUS

## 2023-01-23 MED ORDER — SODIUM CHLORIDE 0.9 % IV SOLN: INTRAVENOUS | Status: DC

## 2023-01-23 MED ORDER — LIDOCAINE HCL (CARDIAC) PF 100 MG/5ML IV SOSY
PREFILLED_SYRINGE | INTRAVENOUS | Status: DC | PRN
  Administered 2023-01-23: 100 mg via INTRAVENOUS

## 2023-01-23 MED ORDER — PROPOFOL 500 MG/50ML IV EMUL
INTRAVENOUS | Status: DC | PRN
  Administered 2023-01-23: 150 ug/kg/min via INTRAVENOUS

## 2023-01-23 MED ORDER — PROPOFOL 10 MG/ML IV BOLUS
INTRAVENOUS | Status: DC | PRN
  Administered 2023-01-23: 50 mg via INTRAVENOUS

## 2023-01-23 NOTE — H&P (Signed)
Outpatient short stay form Pre-procedure 01/23/2023  Lesly Rubenstein, MD  Primary Physician: Jodi Marble, MD  Reason for visit:  Dysphagia/Dyspepsia/Colon cancer screening  History of present illness:    57 y/o gentleman with history of tobacco abuse, hypothyroidism, and hypertension here for EGD/Colonoscopy for dysphagia/dyspepsia and colon cancer screening. Never had these procedures before. Takes aspirin 325 with last dose Sunday, no other blood thinners. No family history of GI malignancies. History of cholecystectomy.    Current Facility-Administered Medications:    0.9 %  sodium chloride infusion, , Intravenous, Continuous, Daphane Odekirk, Hilton Cork, MD, Last Rate: 20 mL/hr at 01/23/23 0951, New Bag at 01/23/23 0951  Medications Prior to Admission  Medication Sig Dispense Refill Last Dose   ezetimibe (ZETIA) 10 MG tablet Take 10 mg by mouth daily.   01/22/2023   Magnesium Oxide 250 MG TABS Take 250 mg by mouth daily.    01/22/2023   aspirin EC 325 MG tablet Take 325 mg by mouth once.   01/21/2023   budesonide-formoterol (SYMBICORT) 80-4.5 MCG/ACT inhaler Inhale 2 puffs into the lungs as needed.   01/21/2023   EPINEPHrine 0.3 mg/0.3 mL IJ SOAJ injection Inject 0.3 mg into the muscle as needed for anaphylaxis.      fenofibrate (TRICOR) 145 MG tablet TAKE 1 TABLET BY MOUTH EVERY MORNING 90 tablet 0 01/21/2023   levothyroxine (SYNTHROID, LEVOTHROID) 175 MCG tablet Take 175 mcg by mouth every morning.  0 01/21/2023   Levothyroxine Sodium (LEVOTHROID PO) Take 0.025 mcg by mouth. (Patient not taking: Reported on 12/25/2022)      lisinopril-hydrochlorothiazide (PRINZIDE,ZESTORETIC) 20-12.5 MG tablet Take 2 tablets by mouth daily. am  0 01/21/2023   metoprolol tartrate (LOPRESSOR) 100 MG tablet Take 1 tablet (100 mg total) by mouth 2 (two) times daily. (Patient taking differently: Take 100 mg by mouth daily. am) 180 tablet 0    rosuvastatin (CRESTOR) 20 MG tablet Take 20 mg by mouth every  morning.      sildenafil (VIAGRA) 100 MG tablet Take by mouth daily as needed.        No Known Allergies   Past Medical History:  Diagnosis Date   A-fib ([place])    Acute cholecystitis 10/06/2016   CHF (congestive heart failure) (HCC)    COPD (chronic obstructive pulmonary disease) (HCC)    Hyperlipidemia    Hypertension    Hypothyroidism    Smokers' cough (HCC)    SVT (supraventricular tachycardia)    benign   Umbilical hernia with obstruction, without gangrene     Review of systems:  Otherwise negative.    Physical Exam  Gen: Alert, oriented. Appears stated age.  HEENT: PERRLA. Lungs: No respiratory distress CV: RRR Abd: soft, benign, no masses Ext: No edema    Planned procedures: Proceed with EGD/colonoscopy. The patient understands the nature of the planned procedure, indications, risks, alternatives and potential complications including but not limited to bleeding, infection, perforation, damage to internal organs and possible oversedation/side effects from anesthesia. The patient agrees and gives consent to proceed.  Please refer to procedure notes for findings, recommendations and patient disposition/instructions.     Lesly Rubenstein, MD [facility]

## 2023-01-23 NOTE — Transfer of Care (Signed)
Immediate Anesthesia Transfer of Care Note  Patient: Max Dixon  Procedure(s) Performed: COLONOSCOPY WITH PROPOFOL ESOPHAGOGASTRODUODENOSCOPY (EGD) WITH PROPOFOL  Patient Location: PACU  Anesthesia Type:General  Level of Consciousness: sedated  Airway & Oxygen Therapy: Patient Spontanous Breathing  Post-op Assessment: Report given to RN and Post -op Vital signs reviewed and stable  Post vital signs: Reviewed and stable  Last Vitals:  Vitals Value Taken Time  BP    Temp    Pulse 77 01/23/23 1054  Resp 17 01/23/23 1054  SpO2 96 % 01/23/23 1054  Vitals shown include unvalidated device data.  Last Pain:  Vitals:   01/23/23 0941  TempSrc: Temporal  PainSc: 0-No pain         Complications: No notable events documented.

## 2023-01-23 NOTE — Op Note (Signed)
[facility] Patient Name: Max Dixon Procedure Date: 01/23/2023 10:11 AM MRN: [id number] Account #: [id number] Date of Birth: [birth year] Admit Type: Outpatient Age: 57 Room: [facility] ENDO ROOM 1 Gender: Male Note Status: Finalized Instrument Name: Upper Endoscope [id number] Procedure:             Upper GI endoscopy Indications:           Dyspepsia, Dysphagia Providers:             Andrey Farmer MD, MD Medicines:             Monitored Anesthesia Care Complications:         No immediate complications. Estimated blood loss:                         Minimal. Procedure:             Pre-Anesthesia Assessment:                        - Prior to the procedure, a History and Physical was                         performed, and patient medications and allergies were                         reviewed. The patient is competent. The risks and                         benefits of the procedure and the sedation options and                         risks were discussed with the patient. All questions                         were answered and informed consent was obtained.                         Patient identification and proposed procedure were                         verified by the physician, the nurse, the                         anesthesiologist, the anesthetist and the technician                         in the endoscopy suite. Mental Status Examination:                         alert and oriented. Airway Examination: normal                         oropharyngeal airway and neck mobility. Respiratory                         Examination: clear to auscultation. CV Examination:                         normal. Prophylactic Antibiotics: The patient does not  require prophylactic antibiotics. Prior                         Anticoagulants: The patient has taken no anticoagulant                         or antiplatelet agents. ASA Grade Assessment:  III - A                         patient with severe systemic disease. After reviewing                         the risks and benefits, the patient was deemed in                         satisfactory condition to undergo the procedure. The                         anesthesia plan was to use monitored anesthesia care                         (MAC). Immediately prior to administration of                         medications, the patient was re-assessed for adequacy                         to receive sedatives. The heart rate, respiratory                         rate, oxygen saturations, blood pressure, adequacy of                         pulmonary ventilation, and response to care were                         monitored throughout the procedure. The physical                         status of the patient was re-assessed after the                         procedure.                        After obtaining informed consent, the endoscope was                         passed under direct vision. Throughout the procedure,                         the patient's blood pressure, pulse, and oxygen                         saturations were monitored continuously. The Endoscope                         was introduced through the mouth, and advanced to the  second part of duodenum. The upper GI endoscopy was                         accomplished without difficulty. The patient tolerated                         the procedure well. Findings:      No endoscopic abnormality was evident in the esophagus to explain the       patient's complaint of dysphagia. Biopsies were obtained from the       proximal and distal esophagus with cold forceps for histology of       suspected eosinophilic esophagitis. Estimated blood loss was minimal.      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 Biopsies were taken with a cold forceps for histology.        Estimated blood loss was minimal.      The examined duodenum was normal. Impression:            - No endoscopic esophageal abnormality to explain                         patient's dysphagia.                        - Gastric antral vascular ectasia without bleeding.                         Biopsied.                        - Normal examined duodenum.                        - Biopsies were taken with a cold forceps for                         evaluation of eosinophilic esophagitis. Recommendation:        - Await pathology results.                        - Perform a colonoscopy today. Procedure Code(s):     --- Professional ---                        [phone], Esophagogastroduodenoscopy, flexible,                         transoral; with biopsy, single or multiple Diagnosis Code(s):     --- Professional ---                        R13.10, Dysphagia, unspecified                        K31.819, Angiodysplasia of stomach and duodenum                         without bleeding                        R10.13, Epigastric pain CPT copyright 2022 [employer]. All rights reserved.  The codes documented in this report are preliminary and upon coder review may  be revised to meet current compliance requirements. Andrey Farmer MD, MD 01/23/2023 10:56:03 AM Number of Addenda: 0 Note Initiated On: 01/23/2023 10:11 AM Estimated Blood Loss:  Estimated blood loss was minimal.      [facility]

## 2023-01-23 NOTE — Op Note (Signed)
[facility] Gastroenterology Patient Name: Max Dixon Procedure Date: 01/23/2023 10:14 AM MRN: [id number] Account #: [id number] Date of Birth: Jul 14, 1966 Admit Type: Outpatient Age: 57 Room: [facility] ENDO ROOM 1 Gender: Male Note Status: Finalized Instrument Name: Jasper Riling [id number] Procedure:             Colonoscopy Indications:           Screening for colorectal malignant neoplasm Providers:             Andrey Farmer MD, MD Medicines:             Monitored Anesthesia Care Complications:         No immediate complications. Estimated blood loss:                         Minimal. Procedure:             Pre-Anesthesia Assessment:                        - Prior to the procedure, a History and Physical was                         performed, and patient medications and allergies were                         reviewed. The patient is competent. The risks and                         benefits of the procedure and the sedation options and                         risks were discussed with the patient. All questions                         were answered and informed consent was obtained.                         Patient identification and proposed procedure were                         verified by the physician, the nurse, the                         anesthesiologist, the anesthetist and the technician                         in the endoscopy suite. Mental Status Examination:                         alert and oriented. Airway Examination: normal                         oropharyngeal airway and neck mobility. Respiratory                         Examination: clear to auscultation. CV Examination:                         normal. Prophylactic Antibiotics: The patient does not  require prophylactic antibiotics. Prior                         Anticoagulants: The patient has taken no anticoagulant                         or antiplatelet agents. ASA Grade  Assessment: III - A                         patient with severe systemic disease. After reviewing                         the risks and benefits, the patient was deemed in                         satisfactory condition to undergo the procedure. The                         anesthesia plan was to use monitored anesthesia care                         (MAC). Immediately prior to administration of                         medications, the patient was re-assessed for adequacy                         to receive sedatives. The heart rate, respiratory                         rate, oxygen saturations, blood pressure, adequacy of                         pulmonary ventilation, and response to care were                         monitored throughout the procedure. The physical                         status of the patient was re-assessed after the                         procedure.                        After obtaining informed consent, the colonoscope was                         passed under direct vision. Throughout the procedure,                         the patient's blood pressure, pulse, and oxygen                         saturations were monitored continuously. The                         Colonoscope was introduced through the anus and  advanced to the the terminal ileum. The colonoscopy                         was performed without difficulty. The patient                         tolerated the procedure well. The quality of the bowel                         preparation was adequate to identify polyps. The                         terminal ileum, ileocecal valve, appendiceal orifice,                         and rectum were photographed. Findings:      The perianal and digital rectal examinations were normal.      The terminal ileum appeared normal.      A 3 mm polyp was found in the sigmoid colon. The polyp was sessile. The       polyp was removed with a cold snare. Resection  and retrieval were       complete. Estimated blood loss was minimal.      A few small-mouthed diverticula were found in the sigmoid colon.      Internal hemorrhoids were found during retroflexion. The hemorrhoids       were Grade I (internal hemorrhoids that do not prolapse).      The exam was otherwise without abnormality on direct and retroflexion       views. Impression:            - The examined portion of the ileum was normal.                        - One 3 mm polyp in the sigmoid colon, removed with a                         cold snare. Resected and retrieved.                        - Diverticulosis in the sigmoid colon.                        - Internal hemorrhoids.                        - The examination was otherwise normal on direct and                         retroflexion views. Recommendation:        - Discharge patient to home.                        - Resume previous diet.                        - Continue present medications.                        - Await pathology results.                        -  Repeat colonoscopy in 5 years for surveillance due                         to requiring extensive washing to achieve adequate                         prep.                        - Return to referring physician as previously                         scheduled. Procedure Code(s):     --- Professional ---                        [phone], Colonoscopy, flexible; with removal of                         tumor(s), polyp(s), or other lesion(s) by snare                         technique Diagnosis Code(s):     --- Professional ---                        Z12.11, Encounter for screening for malignant neoplasm                         of colon                        K64.0, First degree hemorrhoids                        D12.5, Benign neoplasm of sigmoid colon                        K57.30, Diverticulosis of large intestine without                         perforation or abscess without  bleeding CPT copyright 2022 American Medical Association. All rights reserved. The codes documented in this report are preliminary and upon coder review may  be revised to meet current compliance requirements. Andrey Farmer MD, MD 01/23/2023 10:59:04 AM Number of Addenda: 0 Note Initiated On: 01/23/2023 10:14 AM Scope Withdrawal Time: 0 hours 11 minutes 23 seconds  Total Procedure Duration: 0 hours 13 minutes 21 seconds  Estimated Blood Loss:  Estimated blood loss was minimal.      [facility]

## 2023-01-23 NOTE — Anesthesia Preprocedure Evaluation (Addendum)
Anesthesia Evaluation  Patient identified by MRN, date of birth, ID band Patient awake    Reviewed: Allergy & Precautions, H&P , NPO status , Patient's Chart, lab work & pertinent test results  Airway Mallampati: III  TM Distance: >3 FB Neck ROM: full    Dental  (+) Edentulous Lower, Edentulous Upper   Pulmonary COPD,  COPD inhaler, Current SmokerPatient did not abstain from smoking.   Pulmonary exam normal        Cardiovascular Exercise Tolerance: Good hypertension, +CHF  Normal cardiovascular exam+ dysrhythmias (pAfib, incomplete RBBB)      Neuro/Psych negative neurological ROS  negative psych ROS   GI/Hepatic negative GI ROS, Neg liver ROS,,,  Endo/Other  Hypothyroidism    Renal/GU negative Renal ROS  negative genitourinary   Musculoskeletal   Abdominal   Peds  Hematology negative hematology ROS (+)   Anesthesia Other Findings Past Medical History: No date: A-fib ([place]) 10/06/2016: Acute cholecystitis No date: CHF (congestive heart failure) (HCC) No date: COPD (chronic obstructive pulmonary disease) (HCC) No date: Hyperlipidemia No date: Hypertension No date: Hypothyroidism No date: Smokers' cough ([place]) No date: SVT (supraventricular tachycardia)     Comment:  benign No date: Umbilical hernia with obstruction, without gangrene  Past Surgical History: 12/12/2017: CATARACT EXTRACTION W/PHACO; Left     Comment:  Procedure: CATARACT EXTRACTION PHACO AND INTRAOCULAR               LENS PLACEMENT ([place]) LEFT;  Surgeon: Leandrew Koyanagi, MD;  Location: [place];  Service:               Ophthalmology;  Laterality: Left; 01/02/2018: CATARACT EXTRACTION W/PHACO; Right     Comment:  Procedure: CATARACT EXTRACTION PHACO AND INTRAOCULAR               LENS PLACEMENT ([place]) RIGHT;  Surgeon: Leandrew Koyanagi, MD;  Location: [place];  Service:                Ophthalmology;  Laterality: Right;  requests early 10/07/2016: CHOLECYSTECTOMY; N/A     Comment:  Procedure: LAPAROSCOPIC CHOLECYSTECTOMY WITH               INTRAOPERATIVE CHOLANGIOGRAM repair of umbilical hernia;               Surgeon: Jules Husbands, MD;  Location: [facility];                Service: General;  Laterality: N/A; 09/11/2016: ELECTROPHYSIOLOGIC STUDY; N/A     Comment:  Procedure: SVT Ablation;  Surgeon: Will Meredith Leeds,               MD;  Location: [place] [facility];  Service:               Cardiovascular;  Laterality: N/A;     Reproductive/Obstetrics negative OB ROS                              Anesthesia Physical Anesthesia Plan  ASA: 3  Anesthesia Plan: General   Post-op Pain Management:    Induction: Intravenous  PONV Risk Score and Plan: Propofol infusion and TIVA  Airway Management Planned:   Additional Equipment:   Intra-op Plan:   Post-operative Plan:   Informed Consent:  I have reviewed the patients History and Physical, chart, labs and discussed the procedure including the risks, benefits and alternatives for the proposed anesthesia with the patient or authorized representative who has indicated his/her understanding and acceptance.     Dental Advisory Given  Plan Discussed with: CRNA and Surgeon  Anesthesia Plan Comments:          Anesthesia Quick Evaluation

## 2023-01-23 NOTE — Interval H&P Note (Signed)
History and Physical Interval Note:  01/23/2023 10:14 AM  Max Dixon  has presented today for surgery, with the diagnosis of epigastric pain,cca screen.  The various methods of treatment have been discussed with the patient and family. After consideration of risks, benefits and other options for treatment, the patient has consented to  Procedure(s): COLONOSCOPY WITH PROPOFOL (N/A) ESOPHAGOGASTRODUODENOSCOPY (EGD) WITH PROPOFOL (N/A) as a surgical intervention.  The patient's history has been reviewed, patient examined, no change in status, stable for surgery.  I have reviewed the patient's chart and labs.  Questions were answered to the patient's satisfaction.     Lesly Rubenstein  Ok to proceed with EGD/Colonoscopy

## 2023-01-24 LAB — SURGICAL PATHOLOGY

## 2023-01-24 NOTE — Anesthesia Postprocedure Evaluation (Signed)
Anesthesia Post Note  Patient: Max Dixon  Procedure(s) Performed: COLONOSCOPY WITH PROPOFOL ESOPHAGOGASTRODUODENOSCOPY (EGD) WITH PROPOFOL  Patient location during evaluation: PACU Anesthesia Type: General Level of consciousness: awake and alert Pain management: pain level controlled Vital Signs Assessment: post-procedure vital signs reviewed and stable Respiratory status: spontaneous breathing, nonlabored ventilation and respiratory function stable Cardiovascular status: blood pressure returned to baseline and stable Postop Assessment: no apparent nausea or vomiting Anesthetic complications: no   No notable events documented.   Last Vitals:  Vitals:   01/23/23 1104 01/23/23 1114  BP: 111/64   Pulse: 76 63  Resp:    Temp:    SpO2: 97% 98%    Last Pain:  Vitals:   01/24/23 0738  TempSrc:   PainSc: 0-No pain                 [place] Ouch

## 2023-01-25 ENCOUNTER — Encounter: Payer: Self-pay | Admitting: Gastroenterology

## 2023-01-28 ENCOUNTER — Other Ambulatory Visit: Payer: Self-pay | Admitting: Internal Medicine

## 2023-01-30 ENCOUNTER — Other Ambulatory Visit: Payer: Self-pay | Admitting: Internal Medicine

## 2023-02-26 ENCOUNTER — Other Ambulatory Visit: Payer: Self-pay | Admitting: Nurse Practitioner

## 2023-02-26 ENCOUNTER — Other Ambulatory Visit: Payer: Self-pay | Admitting: Internal Medicine

## 2023-03-27 ENCOUNTER — Other Ambulatory Visit: Payer: Self-pay | Admitting: Internal Medicine

## 2023-03-30 ENCOUNTER — Other Ambulatory Visit: Payer: Self-pay | Admitting: Internal Medicine

## 2023-03-30 ENCOUNTER — Other Ambulatory Visit: Payer: Self-pay | Admitting: Nurse Practitioner

## 2023-04-24 ENCOUNTER — Other Ambulatory Visit: Payer: Self-pay | Admitting: Internal Medicine

## 2023-05-23 ENCOUNTER — Telehealth: Payer: Self-pay | Admitting: Internal Medicine

## 2023-05-23 NOTE — Telephone Encounter (Signed)
Patient's wife called wanting to pick up lab order sheets so they can have their blood drawn through her works lab because it is free. Please enter lab orders and let me know when done so I can print and let her know to come pick them up.

## 2023-05-25 ENCOUNTER — Other Ambulatory Visit: Payer: Self-pay | Admitting: Internal Medicine

## 2023-05-25 DIAGNOSIS — E782 Mixed hyperlipidemia: Secondary | ICD-10-CM

## 2023-05-25 DIAGNOSIS — E038 Other specified hypothyroidism: Secondary | ICD-10-CM

## 2023-06-11 ENCOUNTER — Ambulatory Visit: Payer: Managed Care, Other (non HMO) | Admitting: Internal Medicine

## 2023-06-11 ENCOUNTER — Encounter: Payer: Self-pay | Admitting: Internal Medicine

## 2023-06-11 VITALS — BP 130/79 | HR 71 | Ht 71.0 in | Wt 200.6 lb

## 2023-06-11 DIAGNOSIS — M62830 Muscle spasm of back: Secondary | ICD-10-CM

## 2023-06-11 MED ORDER — NAPROXEN SODIUM 550 MG PO TABS: 550.0000 mg | ORAL_TABLET | Freq: Two times a day (BID) | ORAL | 0 refills | Status: AC

## 2023-06-11 MED ORDER — CYCLOBENZAPRINE HCL 10 MG PO TABS: 10.0000 mg | ORAL_TABLET | Freq: Three times a day (TID) | ORAL | Status: AC | PRN

## 2023-06-11 NOTE — Progress Notes (Signed)
Established Patient Office Visit  Subjective:  Patient ID: Max Dixon, male    DOB: 1966-04-01  Age: 57 y.o. MRN: [id number]  Chief Complaint  Patient presents with   Acute Visit    Swelling & pain in neck    C/o pain and fullness on both sides of his neck and fullness in the supraclavicular area. Pain 8/10 partially relieved by ibuprofen, no known exacerbating factors. Unrelieved by application of heat or ice. Failed to have previsit labs done.     No other concerns at this time.   Past Medical History:  Diagnosis Date   A-fib [facility])    Acute cholecystitis 10/06/2016   CHF (congestive heart failure) (HCC)    COPD (chronic obstructive pulmonary disease) (HCC)    Hyperlipidemia    Hypertension    Hypothyroidism    Smokers' cough (HCC)    SVT (supraventricular tachycardia)    benign   Umbilical hernia with obstruction, without gangrene     Past Surgical History:  Procedure Laterality Date   CATARACT EXTRACTION W/PHACO Left 12/12/2017   Procedure: CATARACT EXTRACTION PHACO AND INTRAOCULAR LENS PLACEMENT ([facility]) LEFT;  Surgeon: Lockie Mola, MD;  Location: [facility];  Service: Ophthalmology;  Laterality: Left;   CATARACT EXTRACTION W/PHACO Right 01/02/2018   Procedure: CATARACT EXTRACTION PHACO AND INTRAOCULAR LENS PLACEMENT ([facility]) RIGHT;  Surgeon: Lockie Mola, MD;  Location: [facility];  Service: Ophthalmology;  Laterality: Right;  requests early   CHOLECYSTECTOMY N/A 10/07/2016   Procedure: LAPAROSCOPIC CHOLECYSTECTOMY WITH INTRAOPERATIVE CHOLANGIOGRAM repair of umbilical hernia;  Surgeon: Leafy Ro, MD;  Location: [facility];  Service: General;  Laterality: N/A;   COLONOSCOPY WITH PROPOFOL N/A 01/23/2023   Procedure: COLONOSCOPY WITH PROPOFOL;  Surgeon: Regis Bill, MD;  Location: [facility];  Service: Endoscopy;  Laterality: N/A;   ELECTROPHYSIOLOGIC STUDY N/A 09/11/2016   Procedure: SVT Ablation;  Surgeon: Will [name], MD;  Location: [facility];  Service: Cardiovascular;  Laterality: N/A;   ESOPHAGOGASTRODUODENOSCOPY (EGD) WITH PROPOFOL N/A 01/23/2023   Procedure: ESOPHAGOGASTRODUODENOSCOPY (EGD) WITH PROPOFOL;  Surgeon: Regis Bill, MD;  Location: [facility];  Service: Endoscopy;  Laterality: N/A;    Social History   Socioeconomic History   Marital status: Married    Spouse name: Not on file   Number of children: Not on file   Years of education: Not on file   Highest education level: Not on file  Occupational History   Not on file  Tobacco Use   Smoking status: Every Day    Current packs/day: 1.00    Average packs/day: 1 pack/day for 43.0 years (43.0 ttl pk-yrs)    Types: Cigarettes   Smokeless tobacco: Never   Tobacco comments:    since age 83  Vaping Use   Vaping status: Never Used  Substance and Sexual Activity   Alcohol use: No   Drug use: No   Sexual activity: Not on file  Other Topics Concern   Not on file  Social History Narrative   Not on file   Social Determinants of Health   Financial Resource Strain: Not on file  Food Insecurity: Not on file  Transportation Needs: Not on file  Physical Activity: Not on file  Stress: Not on file  Social Connections: Not on file  Intimate Partner Violence: Not on file    Family History  Problem Relation Age of Onset   Hypertension Father     No Known Allergies  Review of Systems  Constitutional: Negative.   HENT: Negative.    Eyes: Negative.   Respiratory: Negative.    Cardiovascular: Negative.   Gastrointestinal: Negative.   Genitourinary: Negative.   Musculoskeletal:  Positive for neck pain.  Skin: Negative.   Neurological: Negative.   Endo/Heme/Allergies: Negative.   Psychiatric/Behavioral:  Negative for depression. The patient is not nervous/anxious and does not have insomnia.        Objective:   BP 130/79   Pulse 71   Ht 5\' 11"  (1.803 m)   Wt 200 lb 9.6 oz (91 kg)   SpO2 97%    BMI 27.98 kg/m   Vitals:   06/11/23 1513  BP: 130/79  Pulse: 71  Height: 5\' 11"  (1.803 m)  Weight: 200 lb 9.6 oz (91 kg)  SpO2: 97%  BMI (Calculated): 27.99    Physical Exam Vitals reviewed.  Constitutional:      Appearance: Normal appearance.  HENT:     Head: Normocephalic.     Left Ear: There is no impacted cerumen.     Nose: Nose normal.     Mouth/Throat:     Mouth: Mucous membranes are moist.     Pharynx: No posterior oropharyngeal erythema.  Eyes:     Extraocular Movements: Extraocular movements intact.     Pupils: Pupils are equal, round, and reactive to light.  Cardiovascular:     Rate and Rhythm: Regular rhythm.     Chest Wall: PMI is not displaced.     Pulses: Decreased pulses.          Dorsalis pedis pulses are 2+ on the right side and 0 on the left side.     Heart sounds: Normal heart sounds. No murmur heard.    Comments: Cold, dusky toes with delayed cap refill Pulmonary:     Effort: Pulmonary effort is normal.     Breath sounds: Normal air entry. No rhonchi or rales.  Abdominal:     General: Abdomen is flat. Bowel sounds are normal. There is no distension.     Palpations: Abdomen is soft. There is no hepatomegaly, splenomegaly or mass.     Tenderness: There is no abdominal tenderness.  Musculoskeletal:        General: Normal range of motion.     Cervical back: Normal range of motion and neck supple.  Skin:    General: Skin is warm and dry.  Neurological:     General: No focal deficit present.     Mental Status: He is alert and oriented to person, place, and time.     Cranial Nerves: No cranial nerve deficit.     Motor: No weakness.  Psychiatric:        Mood and Affect: Mood normal.        Behavior: Behavior normal.      No results found for any visits on 06/11/23.  No results found for this or any previous visit (from the past 2160 hour(Lil Lep[place])).    Assessment & Plan:  As per problem list  Problem List Items Addressed This Visit   None Visit  Diagnoses     Spasm of both trapezius muscles    -  Primary   Relevant Medications   cyclobenzaprine (FLEXERIL) 10 MG tablet   naproxen sodium (ANAPROX DS) 550 MG tablet       Return in about 3 weeks (around 07/02/2023) for cpe with labs prior.   Total time spent: 30 minutes  Luna Fuse, MD  06/11/2023   This document  may have been prepared by [employer] Voice Recognition software and as such may include unintentional dictation errors.

## 2023-06-20 ENCOUNTER — Other Ambulatory Visit: Payer: Self-pay | Admitting: Family

## 2023-06-20 ENCOUNTER — Other Ambulatory Visit: Payer: Self-pay | Admitting: Internal Medicine

## 2023-07-20 ENCOUNTER — Other Ambulatory Visit: Payer: Self-pay | Admitting: Internal Medicine

## 2023-08-20 ENCOUNTER — Encounter: Payer: Managed Care, Other (non HMO) | Admitting: Internal Medicine

## 2023-08-26 ENCOUNTER — Other Ambulatory Visit: Payer: Self-pay | Admitting: Internal Medicine

## 2023-09-25 ENCOUNTER — Ambulatory Visit: Payer: Managed Care, Other (non HMO) | Admitting: Internal Medicine

## 2023-09-25 ENCOUNTER — Encounter: Payer: Self-pay | Admitting: Internal Medicine

## 2023-09-25 VITALS — BP 140/86 | HR 83 | Ht 71.0 in | Wt 195.8 lb

## 2023-09-25 DIAGNOSIS — I4719 Other supraventricular tachycardia: Secondary | ICD-10-CM

## 2023-09-25 DIAGNOSIS — E782 Mixed hyperlipidemia: Secondary | ICD-10-CM | POA: Insufficient documentation

## 2023-09-25 DIAGNOSIS — I739 Peripheral vascular disease, unspecified: Secondary | ICD-10-CM | POA: Insufficient documentation

## 2023-09-25 DIAGNOSIS — Z0130 Encounter for examination of blood pressure without abnormal findings: Secondary | ICD-10-CM

## 2023-09-25 DIAGNOSIS — E038 Other specified hypothyroidism: Secondary | ICD-10-CM | POA: Insufficient documentation

## 2023-09-25 DIAGNOSIS — N521 Erectile dysfunction due to diseases classified elsewhere: Secondary | ICD-10-CM

## 2023-09-25 MED ORDER — METOPROLOL TARTRATE 100 MG PO TABS: 100.0000 mg | ORAL_TABLET | Freq: Two times a day (BID) | ORAL | 0 refills | Status: DC

## 2023-09-25 MED ORDER — EZETIMIBE 10 MG PO TABS: 10.0000 mg | ORAL_TABLET | Freq: Every day | ORAL | 0 refills | Status: DC

## 2023-09-25 MED ORDER — EROXON EX GEL: 1.0000 | CUTANEOUS | 1 refills | Status: DC

## 2023-09-25 MED ORDER — ROSUVASTATIN CALCIUM 40 MG PO TABS: 40.0000 mg | ORAL_TABLET | Freq: Every day | ORAL | 0 refills | Status: DC

## 2023-09-25 MED ORDER — FENOFIBRATE 145 MG PO TABS: 145.0000 mg | ORAL_TABLET | Freq: Every morning | ORAL | 0 refills | Status: DC

## 2023-09-25 MED ORDER — TADALAFIL 20 MG PO TABS: 20.0000 mg | ORAL_TABLET | Freq: Every day | ORAL | 0 refills | Status: DC | PRN

## 2023-09-25 NOTE — Progress Notes (Signed)
Established Patient Office Visit  Subjective:  Patient ID: Max Dixon, male    DOB: 23-Nov-1965  Age: 57 y.o. MRN: [id number]  Chief Complaint  Patient presents with   Follow-up    Medication refills    No new complaints, here for lab review and medication refills. Labs done a month ago but results unavailable.   No other concerns at this time.   Past Medical History:  Diagnosis Date   A-fib [facility])    Acute cholecystitis 10/06/2016   CHF (congestive heart failure) (HCC)    COPD (chronic obstructive pulmonary disease) (HCC)    Hyperlipidemia    Hypertension    Hypothyroidism    Smokers' cough (HCC)    SVT (supraventricular tachycardia) (HCC)    benign   Umbilical hernia with obstruction, without gangrene     Past Surgical History:  Procedure Laterality Date   CATARACT EXTRACTION W/PHACO Left 12/12/2017   Procedure: CATARACT EXTRACTION PHACO AND INTRAOCULAR LENS PLACEMENT (IOC) LEFT;  Surgeon: Lockie Mola, MD;  Location: [facility];  Service: Ophthalmology;  Laterality: Left;   CATARACT EXTRACTION W/PHACO Right 01/02/2018   Procedure: CATARACT EXTRACTION PHACO AND INTRAOCULAR LENS PLACEMENT (IOC) RIGHT;  Surgeon: Lockie Mola, MD;  Location: [facility];  Service: Ophthalmology;  Laterality: Right;  requests early   CHOLECYSTECTOMY N/A 10/07/2016   Procedure: LAPAROSCOPIC CHOLECYSTECTOMY WITH INTRAOPERATIVE CHOLANGIOGRAM repair of umbilical hernia;  Surgeon: Leafy Ro, MD;  Location: [facility];  Service: General;  Laterality: N/A;   COLONOSCOPY WITH PROPOFOL N/A 01/23/2023   Procedure: COLONOSCOPY WITH PROPOFOL;  Surgeon: Regis Bill, MD;  Location: [facility];  Service: Endoscopy;  Laterality: N/A;   ELECTROPHYSIOLOGIC STUDY N/A 09/11/2016   Procedure: SVT Ablation;  Surgeon: Will [name], MD;  Location: [facility];  Service: Cardiovascular;  Laterality: N/A;   ESOPHAGOGASTRODUODENOSCOPY (EGD) WITH PROPOFOL  N/A 01/23/2023   Procedure: ESOPHAGOGASTRODUODENOSCOPY (EGD) WITH PROPOFOL;  Surgeon: Regis Bill, MD;  Location: [facility];  Service: Endoscopy;  Laterality: N/A;    Social History   Socioeconomic History   Marital status: Married    Spouse name: Not on file   Number of children: Not on file   Years of education: Not on file   Highest education level: Not on file  Occupational History   Not on file  Tobacco Use   Smoking status: Every Day    Current packs/day: 1.00    Average packs/day: 1 pack/day for 43.0 years (43.0 ttl pk-yrs)    Types: Cigarettes   Smokeless tobacco: Never   Tobacco comments:    since age 86  Vaping Use   Vaping status: Never Used  Substance and Sexual Activity   Alcohol use: No   Drug use: No   Sexual activity: Not on file  Other Topics Concern   Not on file  Social History Narrative   Not on file   Social Determinants of Health   Financial Resource Strain: Not on file  Food Insecurity: Not on file  Transportation Needs: Not on file  Physical Activity: Not on file  Stress: Not on file  Social Connections: Not on file  Intimate Partner Violence: Not on file    Family History  Problem Relation Age of Onset   Hypertension Father     No Known Allergies  Outpatient Medications Prior to Visit  Medication Sig   aspirin EC 325 MG tablet Take 325 mg by mouth once.   budesonide-formoterol (SYMBICORT) 80-4.5 MCG/ACT inhaler INHALE  2 PUFFS BY MOUTH TWICE DAILY   levothyroxine (SYNTHROID) 175 MCG tablet TAKE 1 TABLET BY MOUTH EVERY MORNING   lisinopril-hydrochlorothiazide (ZESTORETIC) 20-12.5 MG tablet TAKE 1 TABLET BY MOUTH TWICE DAILY   Magnesium Oxide 250 MG TABS Take 250 mg by mouth daily.    [DISCONTINUED] ezetimibe (ZETIA) 10 MG tablet TAKE 1 TABLET BY MOUTH EVERY DAY   [DISCONTINUED] fenofibrate (TRICOR) 145 MG tablet TAKE 1 TABLET BY MOUTH EVERY MORNING   [DISCONTINUED] metoprolol tartrate (LOPRESSOR) 100 MG tablet TAKE 1  TABLET BY MOUTH TWICE DAILY   [DISCONTINUED] rosuvastatin (CRESTOR) 40 MG tablet TAKE 1 TABLET BY MOUTH EVERY NIGHT AT BEDTIME   [DISCONTINUED] sildenafil (VIAGRA) 100 MG tablet TAKE ONE TABLET BY MOUTH EVERY DAY AS NEEDED   EPINEPHrine 0.3 mg/0.3 mL IJ SOAJ injection Inject 0.3 mg into the muscle as needed for anaphylaxis. (Patient not taking: Reported on 09/25/2023)   Levothyroxine Sodium (LEVOTHROID PO) Take 0.025 mcg by mouth. (Patient not taking: Reported on 12/25/2022)   No facility-administered medications prior to visit.    Review of Systems  Constitutional: Negative.   HENT: Negative.    Eyes: Negative.   Respiratory: Negative.    Cardiovascular: Negative.   Gastrointestinal: Negative.   Genitourinary: Negative.   Musculoskeletal:  Positive for neck pain.  Skin: Negative.   Neurological: Negative.   Endo/Heme/Allergies: Negative.   Psychiatric/Behavioral:  Negative for depression. The patient is not nervous/anxious and does not have insomnia.        Objective:   BP (!) 140/86   Pulse 83   Ht 5\' 11"  (1.803 m)   Wt 195 lb 12.8 oz (88.8 kg)   SpO2 90%   BMI 27.31 kg/m   Vitals:   09/25/23 1503  BP: (!) 140/86  Pulse: 83  Height: 5\' 11"  (1.803 m)  Weight: 195 lb 12.8 oz (88.8 kg)  SpO2: 90%  BMI (Calculated): 27.32    Physical Exam Vitals reviewed.  Constitutional:      Appearance: Normal appearance.  HENT:     Head: Normocephalic.     Left Ear: There is no impacted cerumen.     Nose: Nose normal.     Mouth/Throat:     Mouth: Mucous membranes are moist.     Pharynx: No posterior oropharyngeal erythema.  Eyes:     Extraocular Movements: Extraocular movements intact.     Pupils: Pupils are equal, round, and reactive to light.  Cardiovascular:     Rate and Rhythm: Regular rhythm.     Chest Wall: PMI is not displaced.     Pulses: Decreased pulses.          Dorsalis pedis pulses are 2+ on the right side and 0 on the left side.     Heart sounds: Normal  heart sounds. No murmur heard.    Comments: Cold, dusky toes with delayed cap refill Pulmonary:     Effort: Pulmonary effort is normal.     Breath sounds: Normal air entry. No rhonchi or rales.  Abdominal:     General: Abdomen is flat. Bowel sounds are normal. There is no distension.     Palpations: Abdomen is soft. There is no hepatomegaly, splenomegaly or mass.     Tenderness: There is no abdominal tenderness.  Musculoskeletal:        General: Normal range of motion.     Cervical back: Normal range of motion and neck supple.  Skin:    General: Skin is warm and dry.  Neurological:  General: No focal deficit present.     Mental Status: He is alert and oriented to person, place, and time.     Cranial Nerves: No cranial nerve deficit.     Motor: No weakness.  Psychiatric:        Mood and Affect: Mood normal.        Behavior: Behavior normal.      No results found for any visits on 09/25/23.  No results found for this or any previous visit (from the past 2160 hour(Levina Boyack)).    Assessment & Plan:  As per problem list. Problem List Items Addressed This Visit       Cardiovascular and Mediastinum   AVNRT (AV nodal re-entry tachycardia) (HCC)   Relevant Medications   ezetimibe (ZETIA) 10 MG tablet   fenofibrate (TRICOR) 145 MG tablet   rosuvastatin (CRESTOR) 40 MG tablet   metoprolol tartrate (LOPRESSOR) 100 MG tablet   tadalafil (CIALIS) 20 MG tablet   PAD (peripheral artery disease) (HCC)   Relevant Medications   ezetimibe (ZETIA) 10 MG tablet   fenofibrate (TRICOR) 145 MG tablet   rosuvastatin (CRESTOR) 40 MG tablet   metoprolol tartrate (LOPRESSOR) 100 MG tablet   tadalafil (CIALIS) 20 MG tablet     Endocrine   Other specified hypothyroidism   Relevant Medications   metoprolol tartrate (LOPRESSOR) 100 MG tablet   Other Relevant Orders   TSH     Other   Mixed hyperlipidemia - Primary   Relevant Medications   ezetimibe (ZETIA) 10 MG tablet   fenofibrate (TRICOR)  145 MG tablet   rosuvastatin (CRESTOR) 40 MG tablet   metoprolol tartrate (LOPRESSOR) 100 MG tablet   tadalafil (CIALIS) 20 MG tablet   Other Relevant Orders   Lipid panel   Other Visit Diagnoses     Erectile dysfunction due to diseases classified elsewhere       Relevant Medications   Intimacy Products (EROXON) GEL   tadalafil (CIALIS) 20 MG tablet       Return in about 3 months (around 12/26/2023) for fu with labs prior.   Total time spent: 20 minutes  Luna Fuse, MD  09/25/2023   This document may have been prepared by [facility] Voice Recognition software and as such may include unintentional dictation errors.

## 2023-09-27 ENCOUNTER — Other Ambulatory Visit: Payer: Self-pay | Admitting: Internal Medicine

## 2023-10-01 ENCOUNTER — Other Ambulatory Visit: Payer: Self-pay | Admitting: Internal Medicine

## 2023-10-23 ENCOUNTER — Other Ambulatory Visit: Payer: Self-pay | Admitting: Internal Medicine

## 2023-10-23 DIAGNOSIS — E782 Mixed hyperlipidemia: Secondary | ICD-10-CM

## 2023-12-26 ENCOUNTER — Other Ambulatory Visit: Payer: Self-pay | Admitting: Internal Medicine

## 2023-12-26 DIAGNOSIS — E782 Mixed hyperlipidemia: Secondary | ICD-10-CM

## 2024-01-02 ENCOUNTER — Telehealth: Payer: Self-pay | Admitting: Internal Medicine

## 2024-01-02 NOTE — Telephone Encounter (Signed)
 Patient's wife called in requesting a lab requisition be printed for his labs so he can go get them drawn somewhere outside of our office. Please enter orders and I will print the lab req and notify them when it is ready for pick up.

## 2024-01-04 ENCOUNTER — Other Ambulatory Visit: Payer: Self-pay | Admitting: Internal Medicine

## 2024-01-04 DIAGNOSIS — E782 Mixed hyperlipidemia: Secondary | ICD-10-CM

## 2024-01-04 DIAGNOSIS — E038 Other specified hypothyroidism: Secondary | ICD-10-CM

## 2024-01-11 ENCOUNTER — Other Ambulatory Visit: Payer: Self-pay | Admitting: Internal Medicine

## 2024-01-11 DIAGNOSIS — E782 Mixed hyperlipidemia: Secondary | ICD-10-CM

## 2024-01-24 ENCOUNTER — Other Ambulatory Visit: Payer: Self-pay

## 2024-01-24 MED ORDER — BUDESONIDE-FORMOTEROL FUMARATE 80-4.5 MCG/ACT IN AERO: 2.0000 | INHALATION_SPRAY | Freq: Two times a day (BID) | RESPIRATORY_TRACT | 0 refills | Status: DC

## 2024-01-25 ENCOUNTER — Other Ambulatory Visit: Payer: Self-pay | Admitting: Internal Medicine

## 2024-02-06 ENCOUNTER — Other Ambulatory Visit: Payer: Self-pay

## 2024-02-06 DIAGNOSIS — N521 Erectile dysfunction due to diseases classified elsewhere: Secondary | ICD-10-CM

## 2024-02-07 MED ORDER — TADALAFIL 20 MG PO TABS: 20.0000 mg | ORAL_TABLET | Freq: Every day | ORAL | 0 refills | Status: AC | PRN

## 2024-02-22 ENCOUNTER — Encounter: Payer: Self-pay | Admitting: Internal Medicine

## 2024-02-22 ENCOUNTER — Ambulatory Visit (INDEPENDENT_AMBULATORY_CARE_PROVIDER_SITE_OTHER): Admitting: Internal Medicine

## 2024-02-22 VITALS — BP 115/60 | HR 68 | Temp 98.4°F | Ht 71.0 in | Wt 203.4 lb

## 2024-02-22 DIAGNOSIS — N521 Erectile dysfunction due to diseases classified elsewhere: Secondary | ICD-10-CM

## 2024-02-22 DIAGNOSIS — E038 Other specified hypothyroidism: Secondary | ICD-10-CM

## 2024-02-22 DIAGNOSIS — J411 Mucopurulent chronic bronchitis: Secondary | ICD-10-CM

## 2024-02-22 DIAGNOSIS — E782 Mixed hyperlipidemia: Secondary | ICD-10-CM

## 2024-02-22 DIAGNOSIS — Z0130 Encounter for examination of blood pressure without abnormal findings: Secondary | ICD-10-CM

## 2024-02-22 MED ORDER — IPRATROPIUM-ALBUTEROL 0.5-2.5 (3) MG/3ML IN SOLN: 3.0000 mL | RESPIRATORY_TRACT | 5 refills | Status: AC | PRN

## 2024-02-22 MED ORDER — TRELEGY ELLIPTA 100-62.5-25 MCG/ACT IN AEPB: 1.0000 | INHALATION_SPRAY | Freq: Every day | RESPIRATORY_TRACT | 1 refills | Status: AC

## 2024-02-22 MED ORDER — COMBIVENT RESPIMAT 20-100 MCG/ACT IN AERS: 1.0000 | INHALATION_SPRAY | Freq: Four times a day (QID) | RESPIRATORY_TRACT | 5 refills | Status: AC | PRN

## 2024-02-22 MED ORDER — SILDENAFIL CITRATE 100 MG PO TABS
100.0000 mg | ORAL_TABLET | ORAL | 1 refills | Status: DC | PRN
Start: 2024-02-22 — End: 2024-08-01

## 2024-02-22 NOTE — Progress Notes (Signed)
 Established Patient Office Visit  Subjective:  Patient ID: Max Dixon, male    DOB: [birth year]  Age: 58 y.o. MRN: [id number]  Chief Complaint  Patient presents with   Follow-up    Lab results    No new complaints, here for lab review and medication refills. LDL and TC well controlled on lab review. Triglycerides also satisfactory and TSH at target. C/o increased shortness of breath and productive cough.    No other concerns at this time.   Past Medical History:  Diagnosis Date   A-fib [facility])    Acute cholecystitis 10/06/2016   CHF (congestive heart failure) (HCC)    COPD (chronic obstructive pulmonary disease) (HCC)    Hyperlipidemia    Hypertension    Hypothyroidism    Smokers' cough (HCC)    SVT (supraventricular tachycardia) (HCC)    benign   Umbilical hernia with obstruction, without gangrene     Past Surgical History:  Procedure Laterality Date   CATARACT EXTRACTION W/PHACO Left 12/12/2017   Procedure: CATARACT EXTRACTION PHACO AND INTRAOCULAR LENS PLACEMENT (IOC) LEFT;  Surgeon: Annell Kidney, MD;  Location: [facility];  Service: Ophthalmology;  Laterality: Left;   CATARACT EXTRACTION W/PHACO Right 01/02/2018   Procedure: CATARACT EXTRACTION PHACO AND INTRAOCULAR LENS PLACEMENT (IOC) RIGHT;  Surgeon: Annell Kidney, MD;  Location: [facility];  Service: Ophthalmology;  Laterality: Right;  requests early   CHOLECYSTECTOMY N/A 10/07/2016   Procedure: LAPAROSCOPIC CHOLECYSTECTOMY WITH INTRAOPERATIVE CHOLANGIOGRAM repair of umbilical hernia;  Surgeon: Alben Alma, MD;  Location: [facility];  Service: General;  Laterality: N/A;   COLONOSCOPY WITH PROPOFOL  N/A 01/23/2023   Procedure: COLONOSCOPY WITH PROPOFOL ;  Surgeon: Shane Darling, MD;  Location: [facility];  Service: Endoscopy;  Laterality: N/A;   ELECTROPHYSIOLOGIC STUDY N/A 09/11/2016   Procedure: SVT Ablation;  Surgeon: Will Cortland Ding, MD;  [facility];  Service: Cardiovascular;  Laterality: N/A;   ESOPHAGOGASTRODUODENOSCOPY (EGD) WITH PROPOFOL  N/A 01/23/2023   Procedure: ESOPHAGOGASTRODUODENOSCOPY (EGD) WITH PROPOFOL ;  Surgeon: Shane Darling, MD;  Location: [facility];  Service: Endoscopy;  Laterality: N/A;    Social History   Socioeconomic History   Marital status: Married    Spouse name: Not on file   Number of children: Not on file   Years of education: Not on file   Highest education level: Not on file  Occupational History   Not on file  Tobacco Use   Smoking status: Every Day    Current packs/day: 1.00    Average packs/day: 1 pack/day for 43.0 years (43.0 ttl pk-yrs)    Types: Cigarettes   Smokeless tobacco: Never   Tobacco comments:    since age 27  Vaping Use   Vaping status: Never Used  Substance and Sexual Activity   Alcohol use: No   Drug use: No   Sexual activity: Not on file  Other Topics Concern   Not on file  Social History Narrative   Not on file   Social Drivers of Health   Financial Resource Strain: Not on file  Food Insecurity: Not on file  Transportation Needs: Not on file  Physical Activity: Not on file  Stress: Not on file  Social Connections: Not on file  Intimate Partner Violence: Not on file    Family History  Problem Relation Age of Onset   Hypertension Father     No Known Allergies  Outpatient Medications Prior to Visit  Medication Sig   aspirin EC 325  MG tablet Take 325 mg by mouth once.   budesonide -formoterol  (SYMBICORT ) 80-4.5 MCG/ACT inhaler Inhale 2 puffs into the lungs 2 (two) times daily.   ezetimibe  (ZETIA ) 10 MG tablet TAKE 1 TABLET(10 MG) BY MOUTH DAILY   fenofibrate  (TRICOR ) 145 MG tablet TAKE 1 TABLET(145 MG) BY MOUTH EVERY MORNING   Intimacy Products (EROXON) GEL Apply 1 Package topically as directed for 4 doses. Apply one tube to glans penis prior to intercourse   levothyroxine  (SYNTHROID ) 175 MCG tablet TAKE 1 TABLET BY MOUTH EVERY MORNING    lisinopril -hydrochlorothiazide  (ZESTORETIC ) 20-12.5 MG tablet TAKE 1 TABLET BY MOUTH TWICE DAILY   Magnesium  Oxide 250 MG TABS Take 250 mg by mouth daily.    metoprolol  tartrate (LOPRESSOR ) 100 MG tablet Take 1 tablet (100 mg total) by mouth 2 (two) times daily.   rosuvastatin  (CRESTOR ) 40 MG tablet TAKE 1 TABLET(40 MG) BY MOUTH AT BEDTIME   tadalafil  (CIALIS ) 20 MG tablet Take 1 tablet (20 mg total) by mouth daily as needed for erectile dysfunction.   EPINEPHrine  0.3 mg/0.3 mL IJ SOAJ injection Inject 0.3 mg into the muscle as needed for anaphylaxis. (Patient not taking: Reported on 02/22/2024)   No facility-administered medications prior to visit.    Review of Systems  Constitutional: Negative.   HENT: Negative.    Eyes: Negative.   Respiratory: Negative.    Cardiovascular: Negative.   Gastrointestinal: Negative.   Genitourinary: Negative.   Musculoskeletal:  Positive for neck pain.  Skin: Negative.   Neurological: Negative.   Endo/Heme/Allergies: Negative.   Psychiatric/Behavioral:  Negative for depression. The patient is not nervous/anxious and does not have insomnia.        Objective:   BP 115/60   Pulse 68   Temp 98.4 F (36.9 C)   Ht 5\' 11"  (1.803 m)   Wt 203 lb 6.4 oz (92.3 kg)   SpO2 90%   BMI 28.37 kg/m   Vitals:   02/22/24 1520  BP: 115/60  Pulse: 68  Temp: 98.4 F (36.9 C)  Height: 5\' 11"  (1.803 m)  Weight: 203 lb 6.4 oz (92.3 kg)  SpO2: 90%  BMI (Calculated): 28.38    Physical Exam Vitals reviewed.  Constitutional:      Appearance: Normal appearance.  HENT:     Head: Normocephalic.     Left Ear: There is no impacted cerumen.     Nose: Nose normal.     Mouth/Throat:     Mouth: Mucous membranes are moist.     Pharynx: No posterior oropharyngeal erythema.  Eyes:     Extraocular Movements: Extraocular movements intact.     Pupils: Pupils are equal, round, and reactive to light.  Cardiovascular:     Rate and Rhythm: Regular rhythm.     Chest  Wall: PMI is not displaced.     Pulses: Decreased pulses.          Dorsalis pedis pulses are 2+ on the right side and 0 on the left side.     Heart sounds: Normal heart sounds. No murmur heard.    Comments: Cold, dusky toes with delayed cap refill Pulmonary:     Effort: Pulmonary effort is normal.     Breath sounds: Decreased air movement present. No rhonchi or rales.  Abdominal:     General: Abdomen is flat. Bowel sounds are normal. There is no distension.     Palpations: Abdomen is soft. There is no hepatomegaly, splenomegaly or mass.     Tenderness: There is no  abdominal tenderness.  Musculoskeletal:        General: Normal range of motion.     Cervical back: Normal range of motion and neck supple.  Skin:    General: Skin is warm and dry.  Neurological:     General: No focal deficit present.     Mental Status: He is alert and oriented to person, place, and time.     Cranial Nerves: No cranial nerve deficit.     Motor: No weakness.  Psychiatric:        Mood and Affect: Mood normal.        Behavior: Behavior normal.      No results found for any visits on 02/22/24.  No results found for this or any previous visit (from the past 2160 hours).    Assessment & Plan:  As per problem list. Problem List Items Addressed This Visit       Endocrine   Other specified hypothyroidism   Relevant Orders   TSH     Other   Mixed hyperlipidemia   Relevant Medications   sildenafil (VIAGRA) 100 MG tablet   Other Relevant Orders   Lipid panel   Comprehensive metabolic panel with GFR   Other Visit Diagnoses       Mucopurulent chronic bronchitis (HCC)    -  Primary   Relevant Medications   Fluticasone-Umeclidin-Vilant (TRELEGY ELLIPTA) 100-62.5-25 MCG/ACT AEPB   ipratropium-albuterol  (DUONEB) 0.5-2.5 (3) MG/3ML SOLN   Ipratropium-Albuterol  (COMBIVENT RESPIMAT) 20-100 MCG/ACT AERS respimat     Erectile dysfunction due to diseases classified elsewhere       Relevant Medications    sildenafil (VIAGRA) 100 MG tablet       Return in about 4 months (around 06/23/2024) for fu with labs prior.   Total time spent: 20 minutes  Arzella Bitters, MD  02/22/2024   This document may have been prepared by [facility] Voice Recognition software and as such may include unintentional dictation errors.

## 2024-02-25 ENCOUNTER — Encounter: Payer: Self-pay | Admitting: Internal Medicine

## 2024-02-28 ENCOUNTER — Other Ambulatory Visit: Payer: Self-pay | Admitting: Internal Medicine

## 2024-03-02 ENCOUNTER — Other Ambulatory Visit: Payer: Self-pay | Admitting: Internal Medicine

## 2024-03-02 DIAGNOSIS — E782 Mixed hyperlipidemia: Secondary | ICD-10-CM

## 2024-04-06 ENCOUNTER — Other Ambulatory Visit: Payer: Self-pay | Admitting: Internal Medicine

## 2024-04-06 DIAGNOSIS — I4719 Other supraventricular tachycardia: Secondary | ICD-10-CM

## 2024-06-08 ENCOUNTER — Other Ambulatory Visit: Payer: Self-pay | Admitting: Internal Medicine

## 2024-06-08 DIAGNOSIS — E782 Mixed hyperlipidemia: Secondary | ICD-10-CM

## 2024-06-17 ENCOUNTER — Other Ambulatory Visit: Payer: Self-pay

## 2024-06-17 DIAGNOSIS — E782 Mixed hyperlipidemia: Secondary | ICD-10-CM

## 2024-06-17 DIAGNOSIS — I4719 Other supraventricular tachycardia: Secondary | ICD-10-CM

## 2024-06-17 DIAGNOSIS — R7301 Impaired fasting glucose: Secondary | ICD-10-CM

## 2024-06-17 DIAGNOSIS — I10 Essential (primary) hypertension: Secondary | ICD-10-CM

## 2024-06-17 MED ORDER — METOPROLOL TARTRATE 100 MG PO TABS: 100.0000 mg | ORAL_TABLET | Freq: Two times a day (BID) | ORAL | 0 refills | Status: DC

## 2024-07-03 ENCOUNTER — Other Ambulatory Visit: Payer: Self-pay | Admitting: Internal Medicine

## 2024-07-12 ENCOUNTER — Other Ambulatory Visit: Payer: Self-pay | Admitting: Internal Medicine

## 2024-07-12 DIAGNOSIS — E782 Mixed hyperlipidemia: Secondary | ICD-10-CM

## 2024-07-15 ENCOUNTER — Ambulatory Visit: Admitting: Internal Medicine

## 2024-08-01 ENCOUNTER — Other Ambulatory Visit: Payer: Self-pay | Admitting: Internal Medicine

## 2024-08-01 ENCOUNTER — Ambulatory Visit: Admitting: Internal Medicine

## 2024-08-01 VITALS — BP 138/70 | HR 69 | Ht 71.0 in | Wt 206.4 lb

## 2024-08-01 DIAGNOSIS — N521 Erectile dysfunction due to diseases classified elsewhere: Secondary | ICD-10-CM | POA: Diagnosis not present

## 2024-08-01 DIAGNOSIS — J411 Mucopurulent chronic bronchitis: Secondary | ICD-10-CM | POA: Diagnosis not present

## 2024-08-01 DIAGNOSIS — I10 Essential (primary) hypertension: Secondary | ICD-10-CM

## 2024-08-01 DIAGNOSIS — E038 Other specified hypothyroidism: Secondary | ICD-10-CM

## 2024-08-01 DIAGNOSIS — E782 Mixed hyperlipidemia: Secondary | ICD-10-CM | POA: Diagnosis not present

## 2024-08-01 MED ORDER — EZETIMIBE 10 MG PO TABS: 10.0000 mg | ORAL_TABLET | Freq: Every day | ORAL | 0 refills | Status: DC

## 2024-08-01 MED ORDER — EROXON EX GEL: 1.0000 | CUTANEOUS | 1 refills | Status: AC

## 2024-08-01 MED ORDER — EROXON EX GEL
1.0000 | CUTANEOUS | 1 refills | Status: DC
Start: 2024-08-01 — End: 2024-08-01

## 2024-08-01 NOTE — Progress Notes (Signed)
 Established Patient Office Visit  Subjective:  Patient ID: Max Dixon, male    DOB: September 13, 1966  Age: 58 y.o. MRN: [id number]  Chief Complaint  Patient presents with   Follow-up    Follow up    C/o worsening dyspnea on exertion. Compliant with inhalers    No other concerns at this time.   Past Medical History:  Diagnosis Date   A-fib [facility])    Acute cholecystitis 10/06/2016   CHF (congestive heart failure) (HCC)    COPD (chronic obstructive pulmonary disease) (HCC)    Hyperlipidemia    Hypertension    Hypothyroidism    Smokers' cough (HCC)    SVT (supraventricular tachycardia)    benign   Umbilical hernia with obstruction, without gangrene     Past Surgical History:  Procedure Laterality Date   CATARACT EXTRACTION W/PHACO Left 12/12/2017   Procedure: CATARACT EXTRACTION PHACO AND INTRAOCULAR LENS PLACEMENT (IOC) LEFT;  Surgeon: Mittie Gaskin, MD;  Location: [facility];  Service: Ophthalmology;  Laterality: Left;   CATARACT EXTRACTION W/PHACO Right 01/02/2018   Procedure: CATARACT EXTRACTION PHACO AND INTRAOCULAR LENS PLACEMENT (IOC) RIGHT;  Surgeon: Mittie Gaskin, MD;  Location: [facility];  Service: Ophthalmology;  Laterality: Right;  requests early   CHOLECYSTECTOMY N/A 10/07/2016   Procedure: LAPAROSCOPIC CHOLECYSTECTOMY WITH INTRAOPERATIVE CHOLANGIOGRAM repair of umbilical hernia;  Surgeon: Laneta JULIANNA Luna, MD;  Location: [facility] ORS;  Service: General;  Laterality: N/A;   COLONOSCOPY WITH PROPOFOL  N/A 01/23/2023   Procedure: COLONOSCOPY WITH PROPOFOL ;  Surgeon: Maryruth Ole DASEN, MD;  Location: [facility] ENDOSCOPY;  Service: Endoscopy;  Laterality: N/A;   ELECTROPHYSIOLOGIC STUDY N/A 09/11/2016   Procedure: SVT Ablation;  Surgeon: Will Gladis Norton, MD;  Location: [facility] INVASIVE CV LAB;  Service: Cardiovascular;  Laterality: N/A;   ESOPHAGOGASTRODUODENOSCOPY (EGD) WITH PROPOFOL  N/A 01/23/2023   Procedure: ESOPHAGOGASTRODUODENOSCOPY (EGD) WITH  PROPOFOL ;  Surgeon: Maryruth Ole DASEN, MD;  Location: [facility] ENDOSCOPY;  Service: Endoscopy;  Laterality: N/A;    Social History   Socioeconomic History   Marital status: Married    Spouse name: Not on file   Number of children: Not on file   Years of education: Not on file   Highest education level: Not on file  Occupational History   Not on file  Tobacco Use   Smoking status: Every Day    Current packs/day: 1.00    Average packs/day: 1 pack/day for 43.0 years (43.0 ttl pk-yrs)    Types: Cigarettes   Smokeless tobacco: Never   Tobacco comments:    since age 51  Vaping Use   Vaping status: Never Used  Substance and Sexual Activity   Alcohol use: No   Drug use: No   Sexual activity: Not on file  Other Topics Concern   Not on file  Social History Narrative   Not on file   Social Drivers of Health   Financial Resource Strain: Not on file  Food Insecurity: Not on file  Transportation Needs: Not on file  Physical Activity: Not on file  Stress: Not on file  Social Connections: Not on file  Intimate Partner Violence: Not on file    Family History  Problem Relation Age of Onset   Hypertension Father     No Known Allergies  Outpatient Medications Prior to Visit  Medication Sig   aspirin EC 325 MG tablet Take 325 mg by mouth once.   EPINEPHrine  0.3 mg/0.3 mL IJ SOAJ injection Inject 0.3 mg into the muscle as needed for  anaphylaxis.   fenofibrate  (TRICOR ) 145 MG tablet TAKE 1 TABLET(145 MG) BY MOUTH EVERY MORNING   Fluticasone-Umeclidin-Vilant (TRELEGY ELLIPTA ) 100-62.5-25 MCG/ACT AEPB Inhale 1 Inhalation into the lungs daily.   Ipratropium-Albuterol  (COMBIVENT  RESPIMAT) 20-100 MCG/ACT AERS respimat Inhale 1 puff into the lungs every 6 (six) hours as needed for wheezing or shortness of breath.   ipratropium-albuterol  (DUONEB) 0.5-2.5 (3) MG/3ML SOLN Take 3 mLs by nebulization every 4 (four) hours as needed.   levothyroxine  (SYNTHROID ) 175 MCG tablet TAKE 1 TABLET BY  MOUTH EVERY MORNING   lisinopril -hydrochlorothiazide  (ZESTORETIC ) 20-12.5 MG tablet TAKE 1 TABLET BY MOUTH TWICE DAILY   Magnesium  Oxide 250 MG TABS Take 250 mg by mouth daily.    metoprolol  tartrate (LOPRESSOR ) 100 MG tablet Take 1 tablet (100 mg total) by mouth 2 (two) times daily.   rosuvastatin  (CRESTOR ) 40 MG tablet TAKE 1 TABLET(40 MG) BY MOUTH AT BEDTIME   sildenafil  (VIAGRA ) 100 MG tablet Take 1 tablet (100 mg total) by mouth as needed for erectile dysfunction.   tadalafil  (CIALIS ) 20 MG tablet Take 1 tablet (20 mg total) by mouth daily as needed for erectile dysfunction.   [DISCONTINUED] ezetimibe  (ZETIA ) 10 MG tablet TAKE 1 TABLET(10 MG) BY MOUTH DAILY   [DISCONTINUED] Intimacy Products (EROXON) GEL Apply 1 Package topically as directed for 4 doses. Apply one tube to glans penis prior to intercourse   [DISCONTINUED] budesonide -formoterol  (SYMBICORT ) 80-4.5 MCG/ACT inhaler INHALE 2 PUFFS BY MOUTH ONCE DAILY (Patient not taking: Reported on 08/01/2024)   No facility-administered medications prior to visit.    Review of Systems  Constitutional: Negative.  Negative for weight loss (gained 3 lb).  HENT: Negative.    Eyes: Negative.   Respiratory:  Positive for cough and wheezing.   Cardiovascular: Negative.   Gastrointestinal: Negative.   Genitourinary: Negative.   Musculoskeletal:  Positive for neck pain.  Skin: Negative.   Neurological: Negative.   Endo/Heme/Allergies: Negative.   Psychiatric/Behavioral:  Negative for depression. The patient is not nervous/anxious and does not have insomnia.        Objective:   BP 138/70   Pulse 69   Ht 5' 11 (1.803 m)   Wt 206 lb 6.4 oz (93.6 kg)   SpO2 90%   BMI 28.79 kg/m   Vitals:   08/01/24 1524  BP: 138/70  Pulse: 69  Height: 5' 11 (1.803 m)  Weight: 206 lb 6.4 oz (93.6 kg)  SpO2: 90%  BMI (Calculated): 28.8    Physical Exam Vitals reviewed.  Constitutional:      Appearance: Normal appearance.  HENT:     Head:  Normocephalic.     Left Ear: There is no impacted cerumen.     Nose: Nose normal.     Mouth/Throat:     Mouth: Mucous membranes are moist.     Pharynx: No posterior oropharyngeal erythema.  Eyes:     Extraocular Movements: Extraocular movements intact.     Pupils: Pupils are equal, round, and reactive to light.  Cardiovascular:     Rate and Rhythm: Regular rhythm.     Chest Wall: PMI is not displaced.     Pulses: Decreased pulses.          Dorsalis pedis pulses are 2+ on the right side and 0 on the left side.     Heart sounds: Normal heart sounds. No murmur heard.    Comments: Cold, dusky toes with delayed cap refill Pulmonary:     Effort: Pulmonary effort is normal.  Breath sounds: Decreased air movement present. No rhonchi or rales.  Abdominal:     General: Abdomen is flat. Bowel sounds are normal. There is no distension.     Palpations: Abdomen is soft. There is no hepatomegaly, splenomegaly or mass.     Tenderness: There is no abdominal tenderness.  Musculoskeletal:        General: Normal range of motion.     Cervical back: Normal range of motion and neck supple.  Skin:    General: Skin is warm and dry.  Neurological:     General: No focal deficit present.     Mental Status: He is alert and oriented to person, place, and time.     Cranial Nerves: No cranial nerve deficit.     Motor: No weakness.  Psychiatric:        Mood and Affect: Mood normal.        Behavior: Behavior normal.      No results found for any visits on 08/01/24.  No results found for this or any previous visit (from the past 2160 hours).    Assessment & Plan:  Antwane was seen today for follow-up.  Mucopurulent chronic bronchitis (HCC) -     Pulmonary Visit  Erectile dysfunction due to diseases classified elsewhere -     Eroxon; Apply 1 Package topically as directed for 4 doses. Apply one tube to glans penis prior to intercourse  Dispense: 1 each; Refill: 1 -     PSA  Mixed hyperlipidemia -      Ezetimibe ; Take 1 tablet (10 mg total) by mouth daily. TAKE 1 TABLET(10 MG) BY MOUTH DAILY  Dispense: 90 tablet; Refill: 0 -     Lipid panel  Other specified hypothyroidism -     TSH  Hypertension, essential -     Comprehensive metabolic panel with GFR -     CBC With Diff/Platelet    Problem List Items Addressed This Visit       Endocrine   Other specified hypothyroidism   Relevant Orders   TSH     Other   Mixed hyperlipidemia   Relevant Medications   ezetimibe  (ZETIA ) 10 MG tablet   Other Relevant Orders   Lipid panel   Other Visit Diagnoses       Mucopurulent chronic bronchitis (HCC)    -  Primary   Relevant Orders   Ambulatory referral to Pulmonology     Erectile dysfunction due to diseases classified elsewhere       Relevant Medications   Intimacy Products (EROXON) GEL   Other Relevant Orders   PSA     Hypertension, essential       Relevant Medications   ezetimibe  (ZETIA ) 10 MG tablet   Other Relevant Orders   Comprehensive metabolic panel with GFR   CBC With Diff/Platelet       Return in about 4 months (around 12/02/2024) for cpe with labs prior.   Total time spent: 20 minutes  Sherrill Cinderella Perry, MD  08/01/2024   This document may have been prepared by [employer] Voice Recognition software and as such may include unintentional dictation errors.

## 2024-08-18 ENCOUNTER — Telehealth: Payer: Self-pay | Admitting: Internal Medicine

## 2024-08-18 ENCOUNTER — Encounter: Payer: Self-pay | Admitting: Internal Medicine

## 2024-08-18 ENCOUNTER — Ambulatory Visit: Payer: Self-pay | Admitting: Internal Medicine

## 2024-08-18 ENCOUNTER — Other Ambulatory Visit: Payer: Self-pay | Admitting: Internal Medicine

## 2024-08-18 VITALS — BP 143/77 | HR 67 | Temp 98.3°F | Resp 16 | Ht 71.0 in | Wt 207.0 lb

## 2024-08-18 DIAGNOSIS — E782 Mixed hyperlipidemia: Secondary | ICD-10-CM

## 2024-08-18 DIAGNOSIS — G4719 Other hypersomnia: Secondary | ICD-10-CM

## 2024-08-18 DIAGNOSIS — J4489 Other specified chronic obstructive pulmonary disease: Secondary | ICD-10-CM

## 2024-08-18 DIAGNOSIS — R0602 Shortness of breath: Secondary | ICD-10-CM | POA: Diagnosis not present

## 2024-08-18 DIAGNOSIS — F17200 Nicotine dependence, unspecified, uncomplicated: Secondary | ICD-10-CM

## 2024-08-18 NOTE — Patient Instructions (Signed)
 Steps to Quit Smoking Smoking tobacco is the leading cause of preventable death. It can affect almost every organ in the body. Smoking puts you and people around you at risk for many serious, long-lasting (chronic) diseases. Quitting smoking can be hard, but it is one of the best things that you can do for your health. It is never too late to quit. Do not give up if you cannot quit the first time. Some people need to try many times to quit. Do your best to stick to your quit plan, and talk with your doctor if you have any questions or concerns. How do I get ready to quit? Pick a date to quit. Set a date within the next 2 weeks to give you time to prepare. Write down the reasons why you are quitting. Keep this list in places where you will see it often. Tell your family, friends, and co-workers that you are quitting. Their support is important. Talk with your doctor about the choices that may help you quit. Find out if your health insurance will pay for these treatments. Know the people, places, things, and activities that make you want to smoke (triggers). Avoid them. What first steps can I take to quit smoking? Throw away all cigarettes at home, at work, and in your car. Throw away the things that you use when you smoke, such as ashtrays and lighters. Clean your car. Empty the ashtray. Clean your home, including curtains and carpets. What can I do to help me quit smoking? Talk with your doctor about taking medicines and seeing a counselor. You are more likely to succeed when you do both. If you are pregnant or breastfeeding: Talk with your doctor about counseling or other ways to quit smoking. Do not take medicine to help you quit smoking unless your doctor tells you to. Quit right away Quit smoking completely, instead of slowly cutting back on how much you smoke over a period of time. Stopping smoking right away may be more successful than slowly quitting. Go to counseling. In-person is best  if this is an option. You are more likely to quit if you go to counseling sessions regularly. Take medicine You may take medicines to help you quit. Some medicines need a prescription, and some you can buy over-the-counter. Some medicines may contain a drug called nicotine to replace the nicotine in cigarettes. Medicines may: Help you stop having the desire to smoke (cravings). Help to stop the problems that come when you stop smoking (withdrawal symptoms). Your doctor may ask you to use: Nicotine patches, gum, or lozenges. Nicotine inhalers or sprays. Non-nicotine medicine that you take by mouth. Find resources Find resources and other ways to help you quit smoking and remain smoke-free after you quit. They include: Online chats with a Veterinary surgeon. Phone quitlines. Printed Materials engineer. Support groups or group counseling. Text messaging programs. Mobile phone apps. Use apps on your mobile phone or tablet that can help you stick to your quit plan. Examples of free services include Quit Guide from the CDC and smokefree.gov  What can I do to make it easier to quit?  Talk to your family and friends. Ask them to support and encourage you. Call a phone quitline, such as 1-800-QUIT-NOW, reach out to support groups, or work with a Veterinary surgeon. Ask people who smoke to not smoke around you. Avoid places that make you want to smoke, such as: Bars. Parties. Smoke-break areas at work. Spend time with people who do not smoke. Lower  the stress in your life. Stress can make you want to smoke. Try these things to lower stress: Getting regular exercise. Doing deep-breathing exercises. Doing yoga. Meditating. What benefits will I see if I quit smoking? Over time, you may have: A better sense of smell and taste. Less coughing and sore throat. A slower heart rate. Lower blood pressure. Clearer skin. Better breathing. Fewer sick days. Summary Quitting smoking can be hard, but it is one of  the best things that you can do for your health. Do not give up if you cannot quit the first time. Some people need to try many times to quit. When you decide to quit smoking, make a plan to help you succeed. Quit smoking right away, not slowly over a period of time. When you start quitting, get help and support to keep you smoke-free. This information is not intended to replace advice given to you by your health care provider. Make sure you discuss any questions you have with your health care provider. Document Revised: 10/07/2021 Document Reviewed: 10/07/2021 Elsevier Patient Education  2024 [employer].

## 2024-08-18 NOTE — Telephone Encounter (Signed)
 SS order emailed to FG-Toni

## 2024-08-18 NOTE — Telephone Encounter (Signed)
 Lvm notifying patient's wife of CT appointment date, arrival time, location with address & telephone #-Toni

## 2024-08-18 NOTE — Progress Notes (Signed)
 [facility] [address] [place], [place] [address]  Pulmonary Sleep Medicine   Office Visit Note  Patient Name: Max Dixon DOB: [birth year] MRN [id number]  Date of Service: 08/18/2024  Complaints/HPI: He is referred for shortness of breath and sputum.He states he brings up dark looking sputum. No blood. He has history of heavy smoking since the age of 3 years. He has noted also CAD. He states he does take trelegy as well combivent  which helps. He states currently smoking 1PPD from 3PPD. He has also noted wheeze. He notes tightness. He has couogh and sputum all day long. He has worked in [employer] most of his life. He is the only smoker in the house.  Patient also has significant snoring at nighttime.  His wife states that sometimes she wonders if he is still breathing and she has to shake him at nighttime.  He states he can doze off sitting in front of the television.  He does not have very restful sleep overall.  Office Spirometry Results:     ROS  General: (-) fever, (-) chills, (-) night sweats, (-) weakness Skin: (-) rashes, (-) itching,. Eyes: (-) visual changes, (-) redness, (-) itching. Nose and Sinuses: (-) nasal stuffiness or itchiness, (-) postnasal drip, (-) nosebleeds, (-) sinus trouble. Mouth and Throat: (-) sore throat, (-) hoarseness. Neck: (-) swollen glands, (-) enlarged thyroid , (-) neck pain. Respiratory: + cough, (-) bloody sputum, + shortness of breath, + wheezing. Cardiovascular: - ankle swelling, (-) chest pain. Lymphatic: (-) lymph node enlargement. Neurologic: (-) numbness, (-) tingling. Psychiatric: (-) anxiety, (-) depression   Current Medication: Outpatient Encounter Medications as of 08/18/2024  Medication Sig   aspirin EC 325 MG tablet Take 325 mg by mouth once.   EPINEPHrine  0.3 mg/0.3 mL IJ SOAJ injection Inject 0.3 mg into the muscle as needed for anaphylaxis.   ezetimibe  (ZETIA ) 10 MG tablet Take 1 tablet (10 mg total)  by mouth daily. TAKE 1 TABLET(10 MG) BY MOUTH DAILY   fenofibrate  (TRICOR ) 145 MG tablet TAKE 1 TABLET(145 MG) BY MOUTH EVERY MORNING   Fluticasone-Umeclidin-Vilant (TRELEGY ELLIPTA ) 100-62.5-25 MCG/ACT AEPB Inhale 1 Inhalation into the lungs daily.   Intimacy Products (EROXON) GEL Apply 1 Package topically as directed for 4 doses. Apply one tube to glans penis prior to intercourse   Ipratropium-Albuterol  (COMBIVENT  RESPIMAT) 20-100 MCG/ACT AERS respimat Inhale 1 puff into the lungs every 6 (six) hours as needed for wheezing or shortness of breath.   ipratropium-albuterol  (DUONEB) 0.5-2.5 (3) MG/3ML SOLN Take 3 mLs by nebulization every 4 (four) hours as needed.   levothyroxine  (SYNTHROID ) 175 MCG tablet TAKE 1 TABLET BY MOUTH EVERY MORNING   lisinopril -hydrochlorothiazide  (ZESTORETIC ) 20-12.5 MG tablet TAKE 1 TABLET BY MOUTH TWICE DAILY   Magnesium  Oxide 250 MG TABS Take 250 mg by mouth daily.    metoprolol  tartrate (LOPRESSOR ) 100 MG tablet Take 1 tablet (100 mg total) by mouth 2 (two) times daily.   rosuvastatin  (CRESTOR ) 40 MG tablet TAKE 1 TABLET(40 MG) BY MOUTH AT BEDTIME   sildenafil  (VIAGRA ) 100 MG tablet TAKE 1 TABLET BY MOUTH DAILY AS NEEDED FOR ERECTILE DYSFUNCTION   tadalafil  (CIALIS ) 20 MG tablet Take 1 tablet (20 mg total) by mouth daily as needed for erectile dysfunction.   No facility-administered encounter medications on file as of 08/18/2024.    Surgical History: Past Surgical History:  Procedure Laterality Date   CATARACT EXTRACTION W/PHACO Left 12/12/2017   Procedure: CATARACT EXTRACTION PHACO AND INTRAOCULAR LENS PLACEMENT (IOC)  LEFT;  Surgeon: Mittie Gaskin, MD;  Location: [facility];  Service: Ophthalmology;  Laterality: Left;   CATARACT EXTRACTION W/PHACO Right 01/02/2018   Procedure: CATARACT EXTRACTION PHACO AND INTRAOCULAR LENS PLACEMENT (IOC) RIGHT;  Surgeon: Mittie Gaskin, MD;  Location: [facility];  Service: Ophthalmology;  Laterality:  Right;  requests early   CHOLECYSTECTOMY N/A 10/07/2016   Procedure: LAPAROSCOPIC CHOLECYSTECTOMY WITH INTRAOPERATIVE CHOLANGIOGRAM repair of umbilical hernia;  Surgeon: Laneta JULIANNA Luna, MD;  Location: [facility] ORS;  Service: General;  Laterality: N/A;   COLONOSCOPY WITH PROPOFOL  N/A 01/23/2023   Procedure: COLONOSCOPY WITH PROPOFOL ;  Surgeon: Maryruth Ole DASEN, MD;  Location: [facility] ENDOSCOPY;  Service: Endoscopy;  Laterality: N/A;   ELECTROPHYSIOLOGIC STUDY N/A 09/11/2016   Procedure: SVT Ablation;  Surgeon: Will Gladis Norton, MD;  Location: [facility] INVASIVE CV LAB;  Service: Cardiovascular;  Laterality: N/A;   ESOPHAGOGASTRODUODENOSCOPY (EGD) WITH PROPOFOL  N/A 01/23/2023   Procedure: ESOPHAGOGASTRODUODENOSCOPY (EGD) WITH PROPOFOL ;  Surgeon: Maryruth Ole DASEN, MD;  Location: [facility] ENDOSCOPY;  Service: Endoscopy;  Laterality: N/A;    Medical History: Past Medical History:  Diagnosis Date   A-fib (HCC)    Acute cholecystitis 10/06/2016   CHF (congestive heart failure) (HCC)    COPD (chronic obstructive pulmonary disease) (HCC)    Hyperlipidemia    Hypertension    Hypothyroidism    Smokers' cough (HCC)    SVT (supraventricular tachycardia)    benign   Umbilical hernia with obstruction, without gangrene     Family History: Family History  Problem Relation Age of Onset   Hypertension Father     Social History: Social History   Socioeconomic History   Marital status: Married    Spouse name: Not on file   Number of children: Not on file   Years of education: Not on file   Highest education level: Not on file  Occupational History   Not on file  Tobacco Use   Smoking status: Every Day    Current packs/day: 1.00    Average packs/day: 1 pack/day for 43.0 years (43.0 ttl pk-yrs)    Types: Cigarettes   Smokeless tobacco: Never   Tobacco comments:    1 PPD  Vaping Use   Vaping status: Never Used  Substance and Sexual Activity   Alcohol use: No   Drug use: No   Sexual activity: Not  on file  Other Topics Concern   Not on file  Social History Narrative   Not on file   Social Drivers of Health   Financial Resource Strain: Not on file  Food Insecurity: Not on file  Transportation Needs: Not on file  Physical Activity: Not on file  Stress: Not on file  Social Connections: Not on file  Intimate Partner Violence: Not on file    Vital Signs: Blood pressure (!) 143/77, pulse 67, temperature 98.3 F (36.8 C), resp. rate 16, height 5' 11 (1.803 m), weight 207 lb (93.9 kg), SpO2 93%.  Examination: General Appearance: The patient is well-developed, well-nourished, and in no distress. Skin: Gross inspection of skin unremarkable. Head: normocephalic, no gross deformities. Eyes: no gross deformities noted. ENT: ears appear grossly normal no exudates. Neck: Supple. No thyromegaly. No LAD. Respiratory: no rhonchi note. Cardiovascular: Normal S1 and S2 without murmur or rub. Extremities: No cyanosis. pulses are equal. Neurologic: Alert and oriented. No involuntary movements.  LABS: No results found for this or any previous visit (from the past 2160 hours).  Radiology: No results found.  No results found.  No results  found.  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 Obstructive chronic bronchitis without exacerbation (HCC) (Primary) With his history of smoking likely has obstructive lung disease.  Will go ahead and get him scheduled for PFT to analyze the severity.  He does continue to smoke and he needs to stop smoking this was discussed with him at length.  2. Smoker He needs to stop smoking in addition to that we will get a CT scan of his chest for screening of lung cancer. - CT CHEST LUNG CA SCREEN LOW DOSE W/O CM; Future  3. Shortness of  breath PFTs were discussed and ordered and we will see how severe his pulmonary disease is. - Pulmonary function test; Future  4. Other hypersomnia He has excessive daytime somnolence snoring witnessed apnea and sleeping while watching TV.  Will get sleep study scheduled for him. - [facility] Sleep Study; Future    General Counseling: I have discussed the findings of the evaluation and examination with Lynwood.  I have also discussed any further diagnostic evaluation thatmay be needed or ordered today. Alyjah verbalizes understanding of the findings of todays visit. We also reviewed his medications today and discussed drug interactions and side effects including but not limited excessive drowsiness and altered mental states. We also discussed that there is always a risk not just to him but also people around him. he has been encouraged to call the office with any questions or concerns that should arise related to todays visit.  No orders of the defined types were placed in this encounter.    Time spent: 32  I have personally obtained a history, examined the patient, evaluated laboratory and imaging results, formulated the assessment and plan and placed orders.    Elfreda DELENA Bathe, MD [facility] Pulmonary and Critical Care Sleep medicine

## 2024-08-19 ENCOUNTER — Other Ambulatory Visit: Payer: Self-pay | Admitting: Internal Medicine

## 2024-08-19 DIAGNOSIS — I4719 Other supraventricular tachycardia: Secondary | ICD-10-CM

## 2024-08-27 ENCOUNTER — Ambulatory Visit: Admitting: Internal Medicine

## 2024-08-27 DIAGNOSIS — R0602 Shortness of breath: Secondary | ICD-10-CM | POA: Diagnosis not present

## 2024-08-29 ENCOUNTER — Ambulatory Visit
Admission: RE | Admit: 2024-08-29 | Discharge: 2024-08-29 | Disposition: A | Discharge status: Home / Self Care | Source: Ambulatory Visit | Attending: Internal Medicine | Admitting: Internal Medicine

## 2024-08-29 DIAGNOSIS — F17200 Nicotine dependence, unspecified, uncomplicated: Secondary | ICD-10-CM | POA: Insufficient documentation

## 2024-09-16 NOTE — Procedures (Signed)
 [facility] [address] [place], [address]    Complete Pulmonary Function Testing Interpretation:  FINDINGS:  Forced vital capacity is severely decreased.  FEV1 is 1.15 L which is 31% of predicted and is severely decreased.  Postbronchodilator there is no significant change in FEV1.  FEV1 FVC ratio is decreased.  Total lung capacity is mildly decreased.  Residual volume is increased residual volume total lung capacity ratio is increased FRC is normal.  DLCO was moderately decreased.  IMPRESSION:  This pulmonary function study is consistent with severe obstructive lung disease  Elfreda DELENA Bathe, MD [facility] Pulmonary Critical Care Medicine Sleep Medicine

## 2024-09-17 LAB — PULMONARY FUNCTION TEST

## 2024-10-20 ENCOUNTER — Ambulatory Visit: Admitting: Internal Medicine

## 2024-10-20 ENCOUNTER — Encounter: Payer: Self-pay | Admitting: Internal Medicine

## 2024-10-20 VITALS — BP 130/60 | HR 67 | Temp 98.0°F | Resp 16 | Ht 71.0 in | Wt 204.0 lb

## 2024-10-20 DIAGNOSIS — R911 Solitary pulmonary nodule: Secondary | ICD-10-CM

## 2024-10-20 DIAGNOSIS — J4489 Other specified chronic obstructive pulmonary disease: Secondary | ICD-10-CM | POA: Diagnosis not present

## 2024-10-20 DIAGNOSIS — F17200 Nicotine dependence, unspecified, uncomplicated: Secondary | ICD-10-CM | POA: Diagnosis not present

## 2024-10-20 LAB — PULMONARY FUNCTION TEST

## 2024-10-20 NOTE — Progress Notes (Signed)
 [facility] [address] [place], [place] [address]  Pulmonary Sleep Medicine   Office Visit Note  Patient Name: Max Dixon DOB: [birth year] MRN [id number]  Date of Service: 10/20/2024  Complaints/HPI: He states he did not go for the sleep study and does not want to go. I tried to explain the benefits but he does not want to do it. He did have a CT chest done shows category 2 benign appearance. He needs to have a followup in 1 year due to ongoing smoking. He is taking inhalers trelegy combivent  and prn nebs. His FEV1 was 31%. Again stressed importance quitting  Office Spirometry Results:     ROS  General: (-) fever, (-) chills, (-) night sweats, (-) weakness Skin: (-) rashes, (-) itching,. Eyes: (-) visual changes, (-) redness, (-) itching. Nose and Sinuses: (-) nasal stuffiness or itchiness, (-) postnasal drip, (-) nosebleeds, (-) sinus trouble. Mouth and Throat: (-) sore throat, (-) hoarseness. Neck: (-) swollen glands, (-) enlarged thyroid , (-) neck pain. Respiratory: - cough, (-) bloody sputum, + shortness of breath, - wheezing. Cardiovascular: - ankle swelling, (-) chest pain. Lymphatic: (-) lymph node enlargement. Neurologic: (-) numbness, (-) tingling. Psychiatric: (-) anxiety, (-) depression   Current Medication: Outpatient Encounter Medications as of 10/20/2024  Medication Sig   aspirin EC 325 MG tablet Take 325 mg by mouth once.   EPINEPHrine  0.3 mg/0.3 mL IJ SOAJ injection Inject 0.3 mg into the muscle as needed for anaphylaxis.   ezetimibe  (ZETIA ) 10 MG tablet TAKE 1 TABLET(10 MG) BY MOUTH DAILY   fenofibrate  (TRICOR ) 145 MG tablet TAKE 1 TABLET(145 MG) BY MOUTH EVERY MORNING   Fluticasone-Umeclidin-Vilant (TRELEGY ELLIPTA ) 100-62.5-25 MCG/ACT AEPB Inhale 1 Inhalation into the lungs daily.   Intimacy Products (EROXON) GEL Apply 1 Package topically as directed for 4 doses. Apply one tube to glans penis prior to intercourse    Ipratropium-Albuterol  (COMBIVENT  RESPIMAT) 20-100 MCG/ACT AERS respimat Inhale 1 puff into the lungs every 6 (six) hours as needed for wheezing or shortness of breath.   ipratropium-albuterol  (DUONEB) 0.5-2.5 (3) MG/3ML SOLN Take 3 mLs by nebulization every 4 (four) hours as needed.   levothyroxine  (SYNTHROID ) 175 MCG tablet TAKE 1 TABLET BY MOUTH EVERY MORNING   lisinopril -hydrochlorothiazide  (ZESTORETIC ) 20-12.5 MG tablet TAKE 1 TABLET BY MOUTH TWICE DAILY   Magnesium  Oxide 250 MG TABS Take 250 mg by mouth daily.    metoprolol  tartrate (LOPRESSOR ) 100 MG tablet TAKE 1 TABLET(100 MG) BY MOUTH TWICE DAILY   rosuvastatin  (CRESTOR ) 40 MG tablet TAKE 1 TABLET(40 MG) BY MOUTH AT BEDTIME   sildenafil  (VIAGRA ) 100 MG tablet TAKE 1 TABLET BY MOUTH DAILY AS NEEDED FOR ERECTILE DYSFUNCTION   tadalafil  (CIALIS ) 20 MG tablet Take 1 tablet (20 mg total) by mouth daily as needed for erectile dysfunction.   No facility-administered encounter medications on file as of 10/20/2024.    Surgical History: Past Surgical History:  Procedure Laterality Date   CATARACT EXTRACTION W/PHACO Left 12/12/2017   Procedure: CATARACT EXTRACTION PHACO AND INTRAOCULAR LENS PLACEMENT (IOC) LEFT;  Surgeon: Mittie Gaskin, MD;  Location: [facility];  Service: Ophthalmology;  Laterality: Left;   CATARACT EXTRACTION W/PHACO Right 01/02/2018   Procedure: CATARACT EXTRACTION PHACO AND INTRAOCULAR LENS PLACEMENT (IOC) RIGHT;  Surgeon: Mittie Gaskin, MD;  Location: [facility];  Service: Ophthalmology;  Laterality: Right;  requests early   CHOLECYSTECTOMY N/A 10/07/2016   Procedure: LAPAROSCOPIC CHOLECYSTECTOMY WITH INTRAOPERATIVE CHOLANGIOGRAM repair of umbilical hernia;  Surgeon: Laneta JULIANNA Luna, MD;  Location: [facility];  Service: General;  Laterality: N/A;   COLONOSCOPY WITH PROPOFOL  N/A 01/23/2023   Procedure: COLONOSCOPY WITH PROPOFOL ;  Surgeon: Maryruth Ole DASEN, MD;  Location: [facility];  Service:  Endoscopy;  Laterality: N/A;   ELECTROPHYSIOLOGIC STUDY N/A 09/11/2016   Procedure: SVT Ablation;  Surgeon: Will Gladis Norton, MD;  Location: [facility];  Service: Cardiovascular;  Laterality: N/A;   ESOPHAGOGASTRODUODENOSCOPY (EGD) WITH PROPOFOL  N/A 01/23/2023   Procedure: ESOPHAGOGASTRODUODENOSCOPY (EGD) WITH PROPOFOL ;  Surgeon: Maryruth Ole DASEN, MD;  Location: [facility];  Service: Endoscopy;  Laterality: N/A;    Medical History: Past Medical History:  Diagnosis Date   A-fib (HCC)    Acute cholecystitis 10/06/2016   CHF (congestive heart failure) (HCC)    COPD (chronic obstructive pulmonary disease) (HCC)    Hyperlipidemia    Hypertension    Hypothyroidism    Smokers' cough (HCC)    SVT (supraventricular tachycardia)    benign   Umbilical hernia with obstruction, without gangrene     Family History: Family History  Problem Relation Age of Onset   Hypertension Father     Social History: Social History   Socioeconomic History   Marital status: Married    Spouse name: Not on file   Number of children: Not on file   Years of education: Not on file   Highest education level: Not on file  Occupational History   Not on file  Tobacco Use   Smoking status: Every Day    Current packs/day: 1.00    Average packs/day: 1 pack/day for 43.0 years (43.0 ttl pk-yrs)    Types: Cigarettes   Smokeless tobacco: Never   Tobacco comments:    1 PPD  Vaping Use   Vaping status: Never Used  Substance and Sexual Activity   Alcohol use: No   Drug use: No   Sexual activity: Not on file  Other Topics Concern   Not on file  Social History Narrative   Not on file   Social Drivers of Health   Tobacco Use: High Risk (10/20/2024)   Patient History    Smoking Tobacco Use: Every Day    Smokeless Tobacco Use: Never    Passive Exposure: Not on file  Financial Resource Strain: Not on file  Food Insecurity: Not on file  Transportation Needs: Not on file  Physical  Activity: Not on file  Stress: Not on file  Social Connections: Not on file  Intimate Partner Violence: Not on file  Depression (EYV7-0): Not on file  Alcohol Screen: Not on file  Housing: Not on file  Utilities: Not on file  Health Literacy: Not on file    Vital Signs: Blood pressure 130/60, pulse 67, temperature 98 F (36.7 C), resp. rate 16, height 5' 11 (1.803 m), weight 204 lb (92.5 kg), SpO2 90%.  Examination: General Appearance: The patient is well-developed, well-nourished, and in no distress. Skin: Gross inspection of skin unremarkable. Head: normocephalic, no gross deformities. Eyes: no gross deformities noted. ENT: ears appear grossly normal no exudates. Neck: Supple. No thyromegaly. No LAD. Respiratory: few rhonchi noted. Cardiovascular: Normal S1 and S2 without murmur or rub. Extremities: No cyanosis. pulses are equal. Neurologic: Alert and oriented. No involuntary movements.  LABS: Recent Results (from the past 2160 hours)  Pulmonary Function Test     Status: None   Collection Time: 09/17/24  3:22 PM  Result Value Ref Range   FEV1     FVC     FEV1/FVC  TLC     DLCO      Radiology: CT CHEST LUNG CA SCREEN LOW DOSE W/O CM Result Date: 09/04/2024 CLINICAL DATA:  43 pack-year smoking history/current smoker EXAM: CT CHEST WITHOUT CONTRAST LOW-DOSE FOR LUNG CANCER SCREENING TECHNIQUE: Multidetector CT imaging of the chest was performed following the standard protocol without IV contrast. RADIATION DOSE REDUCTION: This exam was performed according to the departmental dose-optimization program which includes automated exposure control, adjustment of the mA and/or kV according to patient size and/or use of iterative reconstruction technique. COMPARISON:  None Available. FINDINGS: Cardiovascular: Aortic atherosclerosis. Normal heart size, without pericardial effusion. Left main and 3 vessel coronary artery calcification. Mediastinum/Nodes: No mediastinal or hilar  adenopathy, given limitations of unenhanced CT. Lungs/Pleura: No pleural fluid. Mild centrilobular emphysema. Bilateral pulmonary nodules of maximally 3.4 mm. Upper Abdomen: Cholecystectomy. Probable splenule adjacent the greater curvature of the stomach at 9 mm. This is new since 10/06/2016 abdominal CT. Normal imaged portions of the liver, spleen, pancreas, adrenal glands, kidneys. Musculoskeletal: Remote anterior right rib fractures. IMPRESSION: 1. Lung-RADS 2, benign appearance or behavior. Continue annual screening with low-dose chest CT without contrast in 12 months. 2. Aortic Atherosclerosis (ICD10-I70.0) and Emphysema (ICD10-J43.9). Coronary artery atherosclerosis. 3. Probable splenule adjacent the greater curvature of the stomach, new since 2017 abdominal CT. Recommend attention on follow-up. Electronically Signed   By: Rockey Kilts M.D.   On: 09/04/2024 14:49    No results found.  No results found.  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 Obstructive chronic bronchitis without exacerbation (HCC) (Primary) Severe disease discussed smoking cessation. No desire to quit right now  2. Smoker NEEDS TO STOP SMOKING - CT CHEST LUNG CA SCREEN LOW DOSE W/O CM; Future  3. Obesity, morbid (HCC) Work on diet exercise and weight loss  4. Pulmonary nodule - CT CHEST LUNG CA SCREEN LOW DOSE W/O CM; Future   General Counseling: I have discussed the findings of the evaluation and examination with [place].  I have also discussed any further diagnostic evaluation thatmay be needed or ordered today. Koichi verbalizes understanding of the findings of todays visit. We also reviewed his medications today and discussed drug interactions and side effects including but not limited excessive  drowsiness and altered mental states. We also discussed that there is always a risk not just to him but also people around him. he has been encouraged to call the office with any questions or concerns that should arise related to todays visit.  Orders Placed This Encounter  Procedures   CT CHEST LUNG CA SCREEN LOW DOSE W/O CM    Standing Status:   Future    Expiration Date:   10/20/2025    Preferred Imaging Location?:   [facility]     Time spent: 39  I have personally obtained a history, examined the patient, evaluated laboratory and imaging results, formulated the assessment and plan and placed orders.    Elfreda DELENA Bathe, MD [facility]monary and Critical [facility] Sleep medicine

## 2024-10-20 NOTE — Addendum Note (Signed)
 Addended by: Lacretia Tindall A on: 10/20/2024 10:29 AM   Modules accepted: Orders

## 2024-11-07 ENCOUNTER — Other Ambulatory Visit: Payer: Self-pay | Admitting: Internal Medicine

## 2024-11-08 ENCOUNTER — Other Ambulatory Visit: Payer: Self-pay | Admitting: Internal Medicine

## 2024-11-08 DIAGNOSIS — I4719 Other supraventricular tachycardia: Secondary | ICD-10-CM

## 2024-11-10 ENCOUNTER — Other Ambulatory Visit: Payer: Self-pay | Admitting: Internal Medicine

## 2025-09-09 ENCOUNTER — Encounter: Admitting: Internal Medicine

## 2025-10-19 ENCOUNTER — Ambulatory Visit: Admitting: Internal Medicine
